# Patient Record
Sex: Male | Born: 1970 | Race: Black or African American | Hispanic: No | Marital: Married | Smoking: Never smoker
Health system: Southern US, Community
[De-identification: ages and names within clinical notes are randomized; demographics above are authoritative.]

## PROBLEM LIST (undated history)

## (undated) DIAGNOSIS — Z9189 Other specified personal risk factors, not elsewhere classified: Secondary | ICD-10-CM

---

## 2009-01-17 ENCOUNTER — Emergency Department (HOSPITAL_COMMUNITY): Admission: EM | Admit: 2009-01-17 | Discharge: 2009-01-17 | Payer: Self-pay | Admitting: Emergency Medicine

## 2009-09-05 ENCOUNTER — Emergency Department (HOSPITAL_COMMUNITY): Admission: EM | Admit: 2009-09-05 | Discharge: 2009-09-06 | Payer: Self-pay | Admitting: Emergency Medicine

## 2010-07-15 ENCOUNTER — Emergency Department (HOSPITAL_COMMUNITY)
Admission: EM | Admit: 2010-07-15 | Discharge: 2010-07-15 | Payer: Self-pay | Source: Home / Self Care | Admitting: Family Medicine

## 2010-07-15 LAB — POCT I-STAT, CHEM 8
Calcium, Ion: 1.19 mmol/L (ref 1.12–1.32)
Creatinine, Ser: 1.5 mg/dL (ref 0.4–1.5)
Glucose, Bld: 213 mg/dL — ABNORMAL HIGH (ref 70–99)
HCT: 49 % (ref 39.0–52.0)
Hemoglobin: 16.7 g/dL (ref 13.0–17.0)
Potassium: 4.1 mEq/L (ref 3.5–5.1)

## 2010-07-15 LAB — GLUCOSE, CAPILLARY: Glucose-Capillary: 222 mg/dL — ABNORMAL HIGH (ref 70–99)

## 2010-09-08 LAB — URINE MICROSCOPIC-ADD ON

## 2010-09-08 LAB — POCT I-STAT, CHEM 8
Chloride: 106 mEq/L (ref 96–112)
HCT: 50 % (ref 39.0–52.0)
Hemoglobin: 17 g/dL (ref 13.0–17.0)
Potassium: 3.7 mEq/L (ref 3.5–5.1)
Sodium: 143 mEq/L (ref 135–145)

## 2010-09-08 LAB — URINALYSIS, ROUTINE W REFLEX MICROSCOPIC
Bilirubin Urine: NEGATIVE
Hgb urine dipstick: NEGATIVE
Ketones, ur: NEGATIVE mg/dL
Specific Gravity, Urine: 1.021 (ref 1.005–1.030)
Urobilinogen, UA: 0.2 mg/dL (ref 0.0–1.0)
pH: 5.5 (ref 5.0–8.0)

## 2010-09-20 LAB — HEMOGLOBIN A1C
Hgb A1c MFr Bld: 6.3 % — ABNORMAL HIGH (ref 4.6–6.1)
Mean Plasma Glucose: 134 mg/dL

## 2011-02-02 ENCOUNTER — Emergency Department (HOSPITAL_COMMUNITY)
Admission: EM | Admit: 2011-02-02 | Discharge: 2011-02-02 | Disposition: A | Discharge status: Home / Self Care | Payer: 59 | Attending: Emergency Medicine | Admitting: Emergency Medicine

## 2011-02-02 DIAGNOSIS — S058X9A Other injuries of unspecified eye and orbit, initial encounter: Secondary | ICD-10-CM | POA: Insufficient documentation

## 2011-02-02 DIAGNOSIS — E119 Type 2 diabetes mellitus without complications: Secondary | ICD-10-CM | POA: Insufficient documentation

## 2011-11-29 ENCOUNTER — Emergency Department (HOSPITAL_COMMUNITY)
Admission: EM | Admit: 2011-11-29 | Discharge: 2011-11-29 | Disposition: A | Discharge status: Home / Self Care | Payer: BC Managed Care – PPO | Source: Home / Self Care | Attending: Emergency Medicine | Admitting: Emergency Medicine

## 2011-11-29 ENCOUNTER — Encounter (HOSPITAL_COMMUNITY): Payer: Self-pay | Admitting: *Deleted

## 2011-11-29 ENCOUNTER — Emergency Department (HOSPITAL_COMMUNITY): Payer: BC Managed Care – PPO

## 2011-11-29 ENCOUNTER — Emergency Department (HOSPITAL_COMMUNITY)
Admission: EM | Admit: 2011-11-29 | Discharge: 2011-11-29 | Disposition: A | Discharge status: Home / Self Care | Payer: BC Managed Care – PPO | Attending: Emergency Medicine | Admitting: Emergency Medicine

## 2011-11-29 DIAGNOSIS — M25519 Pain in unspecified shoulder: Secondary | ICD-10-CM

## 2011-11-29 DIAGNOSIS — M67919 Unspecified disorder of synovium and tendon, unspecified shoulder: Secondary | ICD-10-CM | POA: Insufficient documentation

## 2011-11-29 DIAGNOSIS — M719 Bursopathy, unspecified: Secondary | ICD-10-CM | POA: Insufficient documentation

## 2011-11-29 DIAGNOSIS — E119 Type 2 diabetes mellitus without complications: Secondary | ICD-10-CM | POA: Insufficient documentation

## 2011-11-29 DIAGNOSIS — R9431 Abnormal electrocardiogram [ECG] [EKG]: Secondary | ICD-10-CM

## 2011-11-29 DIAGNOSIS — R079 Chest pain, unspecified: Secondary | ICD-10-CM | POA: Insufficient documentation

## 2011-11-29 DIAGNOSIS — M25511 Pain in right shoulder: Secondary | ICD-10-CM

## 2011-11-29 LAB — CBC
MCHC: 35.5 g/dL (ref 30.0–36.0)
Platelets: 262 10*3/uL (ref 150–400)
RDW: 13.8 % (ref 11.5–15.5)
WBC: 6.2 10*3/uL (ref 4.0–10.5)

## 2011-11-29 LAB — TROPONIN I: Troponin I: <0.3 ng/mL (ref <0.30)

## 2011-11-29 LAB — POCT I-STAT, CHEM 8
HCT: 44 % (ref 39.0–52.0)
Hemoglobin: 15 g/dL (ref 13.0–17.0)
Potassium: 3.8 mEq/L (ref 3.5–5.1)
Sodium: 142 mEq/L (ref 135–145)

## 2011-11-29 LAB — POCT I-STAT TROPONIN I: Troponin i, poc: 0 ng/mL (ref 0.00–0.08)

## 2011-11-29 NOTE — ED Notes (Signed)
Pt with c/o right shoulder pain - radiates down right arm  x months worse with movement - unknown injury - pt states " friends told me could be my heart so wanted to get checked"  Denies cp or sob

## 2011-11-29 NOTE — ED Provider Notes (Signed)
History     CSN: [id number]  Arrival date & time 11/29/11  1139   First MD Initiated Contact with Patient 11/29/11 1245      Chief Complaint  Patient presents with  . Shoulder Pain    (Consider location/radiation/quality/duration/timing/severity/associated sxs/prior treatment) HPI Comments: Patient reports dull, achy, right shoulder pain for "months". He denies any recent or remote history of trauma to his shoulder. States that the pain is waking him up at night, and sometimes he wakes up the morning with his right arm numb. His pain  is aggravated with use of his right arm, especially when reaching over his head, and better with ibuprofen. He does not do repetitive or heavy lifting at work. He did use to do a lot of weight lifting. He also reports occasional chest discomfort described as "gas", but is currently not having any. This does not seem to be related to  exertion, position, eating. no nausea, vomiting, diaphoresis when having this chest discomfort. No coughing, wheezing, shortness of breath no palpitations, presyncope, syncope. No dyspnea on exertion. He is diabetic, but states that his glucose is under good control, usually in the 130s. No known family history of early coronary artery disease.    ROS as noted in HPI. All other ROS negative.   Patient is a 41 y.o. male presenting with shoulder pain. The history is provided by the patient. No language interpreter was used.  Shoulder Pain This is a recurrent problem. The current episode started more than 1 week ago.    Past Medical History  Diagnosis Date  . Diabetes mellitus     History reviewed. No pertinent past surgical history.  History reviewed. No pertinent family history.  History  Substance Use Topics  . Smoking status: Never Smoker   . Smokeless tobacco: Not on file  . Alcohol Use: Yes      Review of Systems  Allergies  Review of patient's allergies indicates no known allergies.  Home Medications    Current Outpatient Rx  Name Route Sig Dispense Refill  . IBUPROFEN 800 MG PO TABS Oral Take 800 mg by mouth every 8 (eight) hours as needed.    Marland Kitchen SITAGLIPTIN-METFORMIN HCL 50-1000 MG PO TABS Oral Take 1 tablet by mouth 2 (two) times daily with a meal.      BP 138/89  Pulse 76  Temp 97.5 F (36.4 C) (Oral)  Resp 18  SpO2 98%  Physical Exam  Nursing note and vitals reviewed. Constitutional: He is oriented to person, place, and time. He appears well-developed and well-nourished. No distress.  HENT:  Head: Normocephalic and atraumatic.  Eyes: Conjunctivae and EOM are normal.  Neck: Normal range of motion.  Cardiovascular: Normal rate, regular rhythm, normal heart sounds and intact distal pulses.   Pulmonary/Chest: Effort normal and breath sounds normal.  Abdominal: Bowel sounds are normal. He exhibits no distension.  Musculoskeletal: Normal range of motion.       Right shoulder: He exhibits tenderness. He exhibits no bony tenderness, no effusion and no crepitus.       Right shoulder with ROM normal. Pain aggravated with abduction above 90.  Drop test normal  , clavicle NT, A/C joint NT, scapula NT, proximal humerus NT, shoulder joint  tender, Motor strength normal, Sensation intact LT over deltoid region, distal NVI with hand on affected side having intact sensation and strength in the distribution of the median, radial, and ulnar nerve function.  , Positive tenderness in bicipital groove,  negative empty can  test, negative liftoff test. no instability with abduction/external rotation.   Neurological: He is alert and oriented to person, place, and time.  Skin: Skin is warm and dry.  Psychiatric: He has a normal mood and affect. His behavior is normal.    ED Course  Procedures (including critical care time)  Labs Reviewed - No data to display No results found.   1. Abnormal EKG   2. Right shoulder pain     EKG: Normal sinus rhythm, rate 64. Normal axis, normal intervals.  No hypertrophy. No ST elevation. TWI inferiorally in II, III, aVF, and anteriorally V4-V6. No previous EKG for comparison.  MDM  41 y/o diabetic AAM with R shoulder pain "for months" worse with use, c/w MSK etiology. Pt denies CP, SOB, palpitations, presyncope, syncope. States DM under control. No known FH CAD. However pt has several cardaic RF, and abnormal EKG. Transferring to ED for further workup.   Luiz Blare, MD 11/29/11 1406

## 2011-11-29 NOTE — Discharge Instructions (Signed)
Rotator Cuff Injury The rotator cuff is the collective set of muscles and tendons that make up the stabilizing unit of your shoulder. This unit holds in the ball of the humerus (upper arm bone) in the socket of the scapula (shoulder blade). Injuries to this stabilizing unit most commonly come from sports or activities that cause the arm to be moved repeatedly over the head. Examples of this include throwing, weight lifting, swimming, racquet sports, or an injury such as falling on your arm. Chronic (longstanding) irritation of this unit can cause inflammation (soreness), bursitis, and eventual damage to the tendons to the point of rupture (tear). An acute (sudden) injury of the rotator cuff can result in a partial or complete tear. You may need surgery with complete tears. Small or partial rotator cuff tears may be treated conservatively with temporary immobilization, exercises and rest. Physical therapy may be needed. HOME CARE INSTRUCTIONS   Apply ice to the injury for 15 to 20 minutes 3 to 4 times per day for the first 2 days. Put the ice in a plastic bag and place a towel between the bag of ice and your skin.   If you have a shoulder immobilizer (sling and straps), do not remove it for as long as directed by your caregiver or until you see a caregiver for a follow-up examination. If you need to remove it, move your arm as little as possible.   You may want to sleep on several pillows or in a recliner at night to lessen swelling and pain.   Only take over-the-counter or prescription medicines for pain, discomfort, or fever as directed by your caregiver.   Do simple hand squeezing exercises with a soft rubber ball to decrease hand swelling.  SEEK MEDICAL CARE IF:   Pain in your shoulder increases or new pain or numbness develops in your arm, hand, or fingers.   Your hand or fingers are colder than your other hand.  SEEK IMMEDIATE MEDICAL CARE IF:   Your arm, hand, or fingers are numb or  tingling.   Your arm, hand, or fingers are increasingly swollen and painful, or turn white or blue.  Document Released: 05/29/2000 Document Revised: 05/21/2011 Document Reviewed: 05/22/2008 [facility] Patient Information 2012 [place], [place].  RESOURCE GUIDE  Dental Problems  Patients with Medicaid: [facility] [phone] [address]                                           [phone] [address] Phone:  [phone]                                                   Phone:  [phone]  If unable to pay or uninsured, contact:  [facility] or [facility]. to become qualified for the adult dental clinic.  Chronic Pain Problems Contact [facility]  [phone] Patients need to be referred by their primary care doctor.  Insufficient Money for Medicine Contact [employer]:  call "211" or [facility] [phone].  No Primary Care Doctor Call [facility]  [phone] Other agencies  that provide inexpensive medical care    [facility]  [phone]    [facility]  [phone]    [facility]  [phone]    [facility]  [phone]    [facility]  [phone]    [facility]  [phone]  Psychological Services [facility]  [phone] [facility]  [phone] [facility]   [phone] (emergency services [phone])  Abuse/Neglect [facility] Child Abuse Hotline [phone] [facility] Child Abuse Hotline [phone] (After Hours)  Emergency Shelter [facility] [phone]  [facility] [place] [facility] [phone] [facility] [phone]  MRSA Hotline #:   830-180-9074    [facility] Resources  [facility]  [employer]                           [facility] [address] [place]                     [address]         [address]  Blondell Reveal Phone:  [phone]                                  Phone:  [phone]                   Phone:  [phone]  [facility] Phone:  [phone]  [facility] Child Abuse Hotline [phone] (After Hours)

## 2011-11-29 NOTE — ED Notes (Signed)
Pt reports right shoulder pain x several months, states that in the last week or two it has got worse. Pt was sent from urgent care for further work up. Pt had ekg completed already, and ekg noted to be abnormal but there is no ekg to compare. Pt reports he has had intermittent numbness and tingling to right arm. No weakness noted. Denies chest pain states however he has had a couple episodes of chest pain in the last couple weeks that have went away on their own.

## 2011-11-29 NOTE — ED Notes (Signed)
Pt states understanding of discharge instructions 

## 2011-11-29 NOTE — ED Provider Notes (Signed)
History     CSN: [id number]  Arrival date & time 11/29/11  1418   First MD Initiated Contact with Patient 11/29/11 1535      Chief Complaint  Patient presents with  . Shoulder Pain    (Consider location/radiation/quality/duration/timing/severity/associated sxs/prior treatment) HPI  41 year old male past medical history of hypercholesterolemia presents today with a chief complaint of 6 months of right shoulder pain.  He also endorses one or two 2 second episodes of chest pressure over the past 3 weeks in his central chest.  He denies any other symptoms suggesting ACS including arm pain jaw pain, diaphoresis, dyspnea, dyspnea with exertion, exercise intolerance, diaphoresis.  His shoulder pain has been ongoing for 7 months and is much worse with movement. 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 He makes his large turned with his right hand exclusively. He can find positions of total comfort with his right shoulder. Movement causes pain in his right shoulder. Holding his son causes pain in his right shoulder. The pain right shoulder is sharp, from 0/10-8/10 with movement or proper positioning.  He presented to urgent care today and underwent an EKG. It was abnormal. He was sent to our emergency department. He calls this illness illness mild. He taken nothing for his pain. His vital signs are normal on arrival.  Past Medical History  Diagnosis Date  . Diabetes mellitus     History reviewed. No pertinent past surgical history.  History reviewed. No pertinent family history.  History  Substance Use Topics  . Smoking status: Never Smoker   . Smokeless tobacco: Not on file  . Alcohol Use: Yes      Review of Systems Constitutional: Negative for fever and chills.  HENT: Negative for ear pain, sore throat and trouble swallowing.   Eyes: Negative for pain and visual disturbance.  Respiratory: Negative for cough and shortness of breath.   Cardiovascular: POS for chest pain (days  ago)  and neg leg swelling.  Gastrointestinal: Negative for nausea, vomiting, abdominal pain and diarrhea.  Genitourinary: Negative for dysuria, urgency and frequency.  Musculoskeletal: Negative for back pain and joint swelling. POS joint swelling.  Skin: Negative for rash and wound.  Neurological: Negative for dizziness, syncope, speech difficulty, weakness and numbness.   Allergies  Review of patient's allergies indicates no known allergies.  Home Medications   Current Outpatient Rx  Name Route Sig Dispense Refill  . IBUPROFEN 800 MG PO TABS Oral Take 800 mg by mouth every 8 (eight) hours as needed.    Marland Kitchen SITAGLIPTIN-METFORMIN HCL 50-1000 MG PO TABS Oral Take 1 tablet by mouth 2 (two) times daily with a meal.      BP 147/95  Pulse 90  Temp 97.3 F (36.3 C) (Oral)  Resp 20  SpO2 96%  Physical Exam Consitutional: Pt in no acute distress.   Head: Normocephalic and atraumatic.  Eyes: Extraocular motion intact, no scleral icterus Neck: Supple without meningismus, mass, or overt JVD Respiratory: Effort normal and breath sounds normal. No respiratory distress. CV: Heart regular rate and regular rhythm (sinus), no obvious murmurs.  Pulses +2 and symmetric Abdomen: Soft, non-tender, non-distended. No rebound or guarding.  MSK: Tenderness anterior glenohumeral joint. Pain on abduction at about 20. Pain with dorsal hand back scratching. Otherwise extremities are atraumatic without deformity, ROM intact Skin: Warm, dry, intact Neuro: Alert and oriented, no motor deficit noted.   Psychiatric: Mood and affect are normal  EKG:  Rate: 64 Rythym Sinus  Interval 144  ms. Axis: normal No gross conduction abnormalities appreciated.  TWI II, III.  STE: V2 (concave), V3: 1 mm elevation convex. The for 1 mm elevation convex. V5 one half millimeter ST elevation convex.   No previous.   ED Course  Procedures (including critical care time)  Labs Reviewed  POCT I-STAT, CHEM 8 - Abnormal;  Notable for the following:    Glucose, Bld 150 (*)     All other components within normal limits  CBC  POCT I-STAT TROPONIN I  TROPONIN I  TROPONIN I   Dg Chest 2 View  11/29/2011  *RADIOLOGY REPORT*  Clinical Data: Right shoulder pain and hand numbness.  CHEST - 2 VIEW  Comparison: None.  Findings:  Cardiopericardial silhouette within normal limits. Mediastinal contours normal. Trachea midline.  No airspace disease or effusion.  IMPRESSION: Negative two-view chest.  Original Report Authenticated By: Andreas [place], M.D.   Dg Shoulder Right  11/29/2011  *RADIOLOGY REPORT*  Clinical Data: Shoulder pain for past month.  RIGHT SHOULDER - 2+ VIEW  Comparison: No priors.  Findings: Three views of the right shoulder demonstrate no acute fracture, subluxation, dislocation, joint or soft tissue abnormality.  IMPRESSION:  1.  No acute radiographic abnormality of the right shoulder.  Original Report Authenticated By: Florencia Reasons, M.D.     1. Rotator cuff disorder   2. Abnormal EKG       MDM  Poor story for acute coronary syndrome. As outlined above the patient has very minimal chest pressure on 2 occasions in the last 2 weeks. No dyspnea with exertion or pain with exertion. No shortness of breath or diaphoresis.    His right shoulder is clearly mechanical/musculoskeletal.  However when he saw rapid care today, he had an abnormal EKG. EKG outlined above. Discuss case with cardiology. He'll see the patient in clinic. We will rule out with 2 troponins here and have the patient followup.   3 troponins negative.  PT DC home stable.  Discussed with pt the clinical impression, treatment in the ED, and follow up plan.  We alslo discussed the indications for returning to the ED, which include shortness or breath, confusion, fever, new weakness or numbness, chest pain, or any other concerning symptom.  The pt understood the treatment and plan, is stable, and is able to leave the ED.           Larrie Kass, MD 11/29/11 [phone]

## 2011-12-01 NOTE — ED Provider Notes (Signed)
I saw and evaluated the patient, reviewed the resident's note and I agree with the findings and plan, ekg interpretation (reviewed by me).  PW right shoulder pain, chronic and now worsening. TTP on exam. Full ROM but with pain. Neurovasc intact. Referred here from [facility] for abnl EKG and reports 2 episodes of CP over past 2-3 weeks. Atypical. Delta troponin and ED W/U unremarkable. Pain control, needs PMD referral.  Forbes Cellar, MD 12/01/11 1616

## 2013-01-20 ENCOUNTER — Encounter (HOSPITAL_COMMUNITY): Payer: Self-pay

## 2013-01-20 ENCOUNTER — Emergency Department (HOSPITAL_COMMUNITY)
Admission: EM | Admit: 2013-01-20 | Discharge: 2013-01-21 | Disposition: A | Discharge status: Home / Self Care | Payer: BC Managed Care – PPO | Attending: Emergency Medicine | Admitting: Emergency Medicine

## 2013-01-20 DIAGNOSIS — R112 Nausea with vomiting, unspecified: Secondary | ICD-10-CM

## 2013-01-20 DIAGNOSIS — Z79899 Other long term (current) drug therapy: Secondary | ICD-10-CM | POA: Insufficient documentation

## 2013-01-20 DIAGNOSIS — E119 Type 2 diabetes mellitus without complications: Secondary | ICD-10-CM | POA: Insufficient documentation

## 2013-01-20 DIAGNOSIS — R195 Other fecal abnormalities: Secondary | ICD-10-CM

## 2013-01-20 DIAGNOSIS — G8929 Other chronic pain: Secondary | ICD-10-CM

## 2013-01-20 DIAGNOSIS — R51 Headache: Secondary | ICD-10-CM | POA: Insufficient documentation

## 2013-01-20 DIAGNOSIS — H1013 Acute atopic conjunctivitis, bilateral: Secondary | ICD-10-CM

## 2013-01-20 DIAGNOSIS — H1045 Other chronic allergic conjunctivitis: Secondary | ICD-10-CM | POA: Insufficient documentation

## 2013-01-20 LAB — COMPREHENSIVE METABOLIC PANEL
Albumin: 4.4 g/dL (ref 3.5–5.2)
Alkaline Phosphatase: 61 U/L (ref 39–117)
BUN: 10 mg/dL (ref 6–23)
Creatinine, Ser: 1.29 mg/dL (ref 0.50–1.35)
Potassium: 3.8 mEq/L (ref 3.5–5.1)
Total Protein: 7.5 g/dL (ref 6.0–8.3)

## 2013-01-20 LAB — CBC WITH DIFFERENTIAL/PLATELET
Basophils Relative: 0 % (ref 0–1)
Eosinophils Absolute: 0.1 10*3/uL (ref 0.0–0.7)
Hemoglobin: 14.1 g/dL (ref 13.0–17.0)
MCH: 29.6 pg (ref 26.0–34.0)
MCHC: 35.8 g/dL (ref 30.0–36.0)
Monocytes Relative: 7 % (ref 3–12)
Neutrophils Relative %: 47 % (ref 43–77)
RDW: 13.9 % (ref 11.5–15.5)

## 2013-01-20 LAB — LIPASE, BLOOD: Lipase: 57 U/L (ref 11–59)

## 2013-01-20 NOTE — ED Notes (Addendum)
Pt reports intermittent headache, nausea, and vomiiting x3 months. Pt reports he is renting a town home and it has been under evaluation leaking pipes in the shower, pt states the "wall caves in" when you touch it with your hand. Pt is concerned his family has been exposed to mold. Pt also c/o dry nose and diarrhea

## 2013-01-21 MED ORDER — NAPHAZOLINE-PHENIRAMINE 0.025-0.3 % OP SOLN: 1.0000 [drp] | OPHTHALMIC | Status: DC | PRN

## 2013-01-21 MED ORDER — ONDANSETRON 4 MG PO TBDP: 4.0000 mg | ORAL_TABLET | Freq: Four times a day (QID) | ORAL | Status: DC | PRN

## 2013-01-21 NOTE — ED Provider Notes (Signed)
CSN: [id number]     Arrival date & time 01/20/13  2047 History     First MD Initiated Contact with Patient 01/21/13 0015     Chief Complaint  Patient presents with  . Headache  . Emesis    HPI Oscar Sampson is a 42 y.o. male presenting with multiple complaints. Patient is having loose stools during the evening, 1-2 times daily. He's had some intermittent nausea and vomiting.  He said no abdominal pain, no fevers and no chills. Patient  also complaining about bilateral eye irritation, redness, watering, no changes in vision otherwise.  Patient is concerned because he moved into a home that has been found to have black mold in the walls, they've been living there for 3 months and symptoms started when he moved in.  He has not seen his primary care physician about this. He has not changed his home medicines. He takes naproxen for his headaches. All of his symptoms have been moderate, intermittent, is not taken anything for them.   Past Medical History  Diagnosis Date  . Diabetes mellitus    History reviewed. No pertinent past surgical history. History reviewed. No pertinent family history. History  Substance Use Topics  . Smoking status: Never Smoker   . Smokeless tobacco: Not on file  . Alcohol Use: Yes    Review of Systems At least 10pt or greater review of systems completed and are negative except where specified in the HPI.  Allergies  Review of patient's allergies indicates no known allergies.  Home Medications   Current Outpatient Rx  Name  Route  Sig  Dispense  Refill  . sitaGLIPtan-metformin (JANUMET) 50-1000 MG per tablet   Oral   Take 1 tablet by mouth 2 (two) times daily with a meal.          BP 137/86  Pulse 94  Temp(Src) 98.1 F (36.7 C) (Oral)  Resp 16  SpO2 95% Physical Exam  Nursing notes reviewed.  Electronic medical record reviewed. VITAL SIGNS:   Filed Vitals:   01/20/13 2051  BP: 137/86  Pulse: 94  Temp: 98.1 F (36.7 C)  TempSrc: Oral   Resp: 16  SpO2: 95%   CONSTITUTIONAL: Awake, oriented, appears non-toxic HENT: Atraumatic, normocephalic, oral mucosa pink and moist, airway patent. Nares patent without drainage. External ears normal. EYES: Conjunctiva clear, EOMI, PERRLA NECK: Trachea midline, non-tender, supple CARDIOVASCULAR: Normal heart rate, Normal rhythm, No murmurs, rubs, gallops PULMONARY/CHEST: Clear to auscultation, no rhonchi, wheezes, or rales. Symmetrical breath sounds. Non-tender. ABDOMINAL: Non-distended, soft, non-tender - no rebound or guarding.  BS normal. NEUROLOGIC: Non-focal, moving all four extremities, no gross sensory or motor deficits. EXTREMITIES: No clubbing, cyanosis, or edema SKIN: Warm, Dry, No erythema, No rash  ED Course   Procedures (including critical care time)  Labs Reviewed  COMPREHENSIVE METABOLIC PANEL - Abnormal; Notable for the following:    Glucose, Bld 153 (*)    AST 54 (*)    ALT 121 (*)    GFR calc non Af Amer 67 (*)    GFR calc Af Amer 78 (*)    All other components within normal limits  CBC WITH DIFFERENTIAL  LIPASE, BLOOD   No results found. 1. Chronic headaches   2. Nausea and vomiting   3. Loose stools   4. Allergic conjunctivitis, bilateral     MDM  Patient presents with multiple complaints, none of which are emergent, patient is benign, benign abdominal exam, soft and dehydrated, is in no pain, no  acute distress, is afebrile, vital signs are stable within normal limits. Triage labs had been ordered, they're unremarkable-patient does have an elevation in AST and ALT is very slight - can follow up with his primary care physician, this may be a result of his Janumet.  I explained the diagnosis and have given explicit precautions to return to the ER including any other new or worsening symptoms. The patient understands and accepts the medical plan as it's been dictated and I have answered their questions. Discharge instructions concerning home care and  prescriptions have been given.  The patient is STABLE and is discharged to home in good condition.   Jones Skene, MD 01/21/13 0045

## 2013-11-09 ENCOUNTER — Other Ambulatory Visit: Payer: Self-pay | Admitting: Specialist

## 2013-11-09 DIAGNOSIS — M25819 Other specified joint disorders, unspecified shoulder: Secondary | ICD-10-CM

## 2013-11-09 DIAGNOSIS — M7580 Other shoulder lesions, unspecified shoulder: Principal | ICD-10-CM

## 2013-11-20 ENCOUNTER — Ambulatory Visit
Admission: RE | Admit: 2013-11-20 | Discharge: 2013-11-20 | Disposition: A | Discharge status: Home / Self Care | Payer: BC Managed Care – PPO | Source: Ambulatory Visit | Attending: Specialist | Admitting: Specialist

## 2013-11-20 DIAGNOSIS — M7580 Other shoulder lesions, unspecified shoulder: Principal | ICD-10-CM

## 2013-11-20 DIAGNOSIS — M25819 Other specified joint disorders, unspecified shoulder: Secondary | ICD-10-CM

## 2013-11-20 MED ORDER — IOHEXOL 180 MG/ML  SOLN
20.0000 mL | Freq: Once | INTRAMUSCULAR | Status: AC | PRN
  Administered 2013-11-20: 20 mL via INTRA_ARTICULAR

## 2013-12-01 ENCOUNTER — Encounter (HOSPITAL_BASED_OUTPATIENT_CLINIC_OR_DEPARTMENT_OTHER): Payer: Self-pay | Admitting: *Deleted

## 2013-12-01 NOTE — Progress Notes (Signed)
12/01/13 1020  OBSTRUCTIVE SLEEP APNEA  Have you ever been diagnosed with sleep apnea through a sleep study? No  Do you snore loudly (loud enough to be heard through closed doors)?  1  Do you often feel tired, fatigued, or sleepy during the daytime? 1  Has anyone observed you stop breathing during your sleep? 0  Do you have, or are you being treated for high blood pressure? 0  BMI more than 35 kg/m2? 0  Age over 43 years old? 0  Neck circumference greater than 40 cm/16 inches? 1  Gender: 1  Obstructive Sleep Apnea Score 4

## 2013-12-01 NOTE — Progress Notes (Signed)
To [facility] at 1230- Istat,Ekg on arrival-instructed NPO after Mn-

## 2013-12-04 ENCOUNTER — Other Ambulatory Visit: Payer: Self-pay | Admitting: Orthopedic Surgery

## 2013-12-04 ENCOUNTER — Encounter (HOSPITAL_BASED_OUTPATIENT_CLINIC_OR_DEPARTMENT_OTHER): Payer: Self-pay | Admitting: *Deleted

## 2013-12-05 ENCOUNTER — Ambulatory Visit (HOSPITAL_BASED_OUTPATIENT_CLINIC_OR_DEPARTMENT_OTHER)
Admission: RE | Admit: 2013-12-05 | Discharge: 2013-12-05 | Disposition: A | Discharge status: Home / Self Care | Payer: BC Managed Care – PPO | Source: Ambulatory Visit | Attending: Specialist | Admitting: Specialist

## 2013-12-05 ENCOUNTER — Ambulatory Visit (HOSPITAL_BASED_OUTPATIENT_CLINIC_OR_DEPARTMENT_OTHER): Payer: BC Managed Care – PPO | Admitting: Anesthesiology

## 2013-12-05 ENCOUNTER — Other Ambulatory Visit: Payer: Self-pay

## 2013-12-05 ENCOUNTER — Encounter (HOSPITAL_BASED_OUTPATIENT_CLINIC_OR_DEPARTMENT_OTHER): Payer: Self-pay | Admitting: *Deleted

## 2013-12-05 ENCOUNTER — Encounter (HOSPITAL_BASED_OUTPATIENT_CLINIC_OR_DEPARTMENT_OTHER): Payer: BC Managed Care – PPO | Admitting: Anesthesiology

## 2013-12-05 ENCOUNTER — Encounter (HOSPITAL_BASED_OUTPATIENT_CLINIC_OR_DEPARTMENT_OTHER)
Admission: RE | Disposition: A | Discharge status: Home / Self Care | Payer: Self-pay | Source: Ambulatory Visit | Attending: Specialist

## 2013-12-05 DIAGNOSIS — M719 Bursopathy, unspecified: Secondary | ICD-10-CM | POA: Insufficient documentation

## 2013-12-05 DIAGNOSIS — M19019 Primary osteoarthritis, unspecified shoulder: Secondary | ICD-10-CM | POA: Insufficient documentation

## 2013-12-05 DIAGNOSIS — M7580 Other shoulder lesions, unspecified shoulder: Secondary | ICD-10-CM

## 2013-12-05 DIAGNOSIS — M67919 Unspecified disorder of synovium and tendon, unspecified shoulder: Secondary | ICD-10-CM | POA: Insufficient documentation

## 2013-12-05 DIAGNOSIS — M25819 Other specified joint disorders, unspecified shoulder: Secondary | ICD-10-CM | POA: Insufficient documentation

## 2013-12-05 DIAGNOSIS — Z79899 Other long term (current) drug therapy: Secondary | ICD-10-CM | POA: Insufficient documentation

## 2013-12-05 DIAGNOSIS — Z9889 Other specified postprocedural states: Secondary | ICD-10-CM

## 2013-12-05 DIAGNOSIS — E119 Type 2 diabetes mellitus without complications: Secondary | ICD-10-CM | POA: Insufficient documentation

## 2013-12-05 DIAGNOSIS — M24119 Other articular cartilage disorders, unspecified shoulder: Secondary | ICD-10-CM | POA: Insufficient documentation

## 2013-12-05 HISTORY — PX: SHOULDER ARTHROSCOPY WITH SUBACROMIAL DECOMPRESSION, ROTATOR CUFF REPAIR AND BICEP TENDON REPAIR: SHX5687

## 2013-12-05 HISTORY — DX: Other specified personal risk factors, not elsewhere classified: Z91.89

## 2013-12-05 LAB — POCT I-STAT 4, (NA,K, GLUC, HGB,HCT)
Glucose, Bld: 140 mg/dL — ABNORMAL HIGH (ref 70–99)
HEMATOCRIT: 43 % (ref 39.0–52.0)
HEMOGLOBIN: 14.6 g/dL (ref 13.0–17.0)
Potassium: 3.8 mEq/L (ref 3.7–5.3)
SODIUM: 143 meq/L (ref 137–147)

## 2013-12-05 LAB — GLUCOSE, CAPILLARY: Glucose-Capillary: 177 mg/dL — ABNORMAL HIGH (ref 70–99)

## 2013-12-05 SURGERY — SHOULDER ARTHROSCOPY WITH SUBACROMIAL DECOMPRESSION, ROTATOR CUFF REPAIR AND BICEP TENDON REPAIR
Anesthesia: General | Site: Shoulder | Laterality: Right

## 2013-12-05 MED ORDER — ROPIVACAINE HCL 5 MG/ML IJ SOLN
Freq: Once | INTRAMUSCULAR | Status: DC
  Filled 2013-12-05 (×4): qty 30

## 2013-12-05 MED ORDER — FENTANYL CITRATE 0.05 MG/ML IJ SOLN
100.0000 ug | Freq: Once | INTRAMUSCULAR | Status: AC
  Administered 2013-12-05: 100 ug via INTRAVENOUS
  Filled 2013-12-05: qty 2

## 2013-12-05 MED ORDER — SODIUM CHLORIDE 0.9 % IR SOLN
Status: DC | PRN
  Administered 2013-12-05: 12000 mL

## 2013-12-05 MED ORDER — FENTANYL CITRATE 0.05 MG/ML IJ SOLN
INTRAMUSCULAR | Status: DC | PRN
  Administered 2013-12-05 (×2): 50 ug via INTRAVENOUS
  Administered 2013-12-05: 100 ug via INTRAVENOUS

## 2013-12-05 MED ORDER — ONDANSETRON HCL 4 MG/2ML IJ SOLN
INTRAMUSCULAR | Status: DC | PRN
  Administered 2013-12-05: 4 mg via INTRAVENOUS

## 2013-12-05 MED ORDER — CEFAZOLIN SODIUM-DEXTROSE 2-3 GM-% IV SOLR
2.0000 g | INTRAVENOUS | Status: AC
  Administered 2013-12-05: 2 g via INTRAVENOUS
  Filled 2013-12-05: qty 50

## 2013-12-05 MED ORDER — FENTANYL CITRATE 0.05 MG/ML IJ SOLN
25.0000 ug | INTRAMUSCULAR | Status: DC | PRN
  Filled 2013-12-05: qty 1

## 2013-12-05 MED ORDER — PROMETHAZINE HCL 25 MG/ML IJ SOLN
6.2500 mg | INTRAMUSCULAR | Status: DC | PRN
  Filled 2013-12-05: qty 1

## 2013-12-05 MED ORDER — EPINEPHRINE HCL 1 MG/ML IJ SOLN
INTRAMUSCULAR | Status: DC | PRN
  Administered 2013-12-05: 2 mg

## 2013-12-05 MED ORDER — ACETAMINOPHEN 10 MG/ML IV SOLN
INTRAVENOUS | Status: DC | PRN
  Administered 2013-12-05: 1000 mg via INTRAVENOUS

## 2013-12-05 MED ORDER — METHOCARBAMOL 500 MG PO TABS: 500.0000 mg | ORAL_TABLET | Freq: Four times a day (QID) | ORAL | Status: DC | PRN

## 2013-12-05 MED ORDER — STERILE WATER FOR IRRIGATION IR SOLN
Status: DC | PRN
  Administered 2013-12-05: 500 mL

## 2013-12-05 MED ORDER — SUCCINYLCHOLINE CHLORIDE 20 MG/ML IJ SOLN
INTRAMUSCULAR | Status: DC | PRN
Start: 2013-12-05 — End: 2013-12-05
  Administered 2013-12-05: 140 mg via INTRAVENOUS

## 2013-12-05 MED ORDER — MIDAZOLAM HCL 2 MG/2ML IJ SOLN
INTRAMUSCULAR | Status: AC
  Filled 2013-12-05: qty 2

## 2013-12-05 MED ORDER — LIDOCAINE HCL (CARDIAC) 20 MG/ML IV SOLN
INTRAVENOUS | Status: DC | PRN
  Administered 2013-12-05: 100 mg via INTRAVENOUS

## 2013-12-05 MED ORDER — CEPHALEXIN 500 MG PO CAPS: 500.0000 mg | ORAL_CAPSULE | Freq: Three times a day (TID) | ORAL | Status: DC

## 2013-12-05 MED ORDER — DEXAMETHASONE SODIUM PHOSPHATE 4 MG/ML IJ SOLN
INTRAMUSCULAR | Status: DC | PRN
  Administered 2013-12-05: 10 mg via INTRAVENOUS

## 2013-12-05 MED ORDER — FENTANYL CITRATE 0.05 MG/ML IJ SOLN
INTRAMUSCULAR | Status: AC
  Filled 2013-12-05: qty 4

## 2013-12-05 MED ORDER — SODIUM CHLORIDE 0.9 % IJ SOLN
INTRAMUSCULAR | Status: DC | PRN
  Administered 2013-12-05: 20 mL

## 2013-12-05 MED ORDER — SODIUM CHLORIDE 0.9 % IV SOLN
INTRAVENOUS | Status: DC
  Filled 2013-12-05: qty 1000

## 2013-12-05 MED ORDER — LACTATED RINGERS IV SOLN
INTRAVENOUS | Status: DC
  Administered 2013-12-05: 18:00:00 via INTRAVENOUS
  Filled 2013-12-05: qty 1000

## 2013-12-05 MED ORDER — METOCLOPRAMIDE HCL 5 MG/ML IJ SOLN
INTRAMUSCULAR | Status: DC | PRN
  Administered 2013-12-05: 10 mg via INTRAVENOUS

## 2013-12-05 MED ORDER — BUPIVACAINE HCL 0.25 % IJ SOLN
INTRAMUSCULAR | Status: DC | PRN
  Administered 2013-12-05: 20 mL

## 2013-12-05 MED ORDER — OXYCODONE-ACETAMINOPHEN 5-325 MG PO TABS: 1.0000 | ORAL_TABLET | ORAL | Status: DC | PRN

## 2013-12-05 MED ORDER — PROPOFOL 10 MG/ML IV BOLUS
INTRAVENOUS | Status: DC | PRN
  Administered 2013-12-05: 200 mg via INTRAVENOUS
  Administered 2013-12-05: 50 mg via INTRAVENOUS

## 2013-12-05 MED ORDER — MIDAZOLAM HCL 2 MG/2ML IJ SOLN
2.0000 mg | Freq: Once | INTRAMUSCULAR | Status: AC
  Administered 2013-12-05: 2 mg via INTRAVENOUS
  Filled 2013-12-05: qty 2

## 2013-12-05 MED ORDER — ROPIVACAINE HCL 5 MG/ML IJ SOLN
INTRAMUSCULAR | Status: DC | PRN
  Administered 2013-12-05: 14:00:00
  Administered 2013-12-05: 30.9 mL

## 2013-12-05 MED ORDER — FENTANYL CITRATE 0.05 MG/ML IJ SOLN
INTRAMUSCULAR | Status: AC
  Filled 2013-12-05: qty 2

## 2013-12-05 MED ORDER — POVIDONE-IODINE 7.5 % EX SOLN
Freq: Once | CUTANEOUS | Status: DC
  Filled 2013-12-05: qty 118

## 2013-12-05 MED ORDER — KETOROLAC TROMETHAMINE 30 MG/ML IJ SOLN
INTRAMUSCULAR | Status: DC | PRN
  Administered 2013-12-05: 30 mg via INTRAVENOUS

## 2013-12-05 MED ORDER — LACTATED RINGERS IV SOLN
INTRAVENOUS | Status: DC
  Administered 2013-12-05 (×2): via INTRAVENOUS
  Filled 2013-12-05: qty 1000

## 2013-12-05 MED ORDER — MEPERIDINE HCL 25 MG/ML IJ SOLN
6.2500 mg | INTRAMUSCULAR | Status: DC | PRN
  Filled 2013-12-05: qty 1

## 2013-12-05 SURGICAL SUPPLY — 48 items
BLADE CUDA GRT WHITE 3.5 (BLADE) ×2 IMPLANT
BLADE GREAT WHITE 4.2 (BLADE) ×2 IMPLANT
BLADE SURG 11 STRL SS (BLADE) ×2 IMPLANT
BLADE SURG 15 STRL LF DISP TIS (BLADE) ×1 IMPLANT
BLADE SURG 15 STRL SS (BLADE) ×1
BUR OVAL 6.0 (BURR) ×2 IMPLANT
CANISTER SUCT LVC 12 LTR MEDI- (MISCELLANEOUS) ×6 IMPLANT
CANNULA 5.75X7 CRYSTAL CLEAR (CANNULA) ×2 IMPLANT
COVER MAYO STAND STRL (DRAPES) ×2 IMPLANT
COVER TABLE BACK 60X90 (DRAPES) ×2 IMPLANT
DRAPE LG THREE QUARTER DISP (DRAPES) ×2 IMPLANT
DRAPE ORTHO SPLIT 77X108 STRL (DRAPES) ×2
DRAPE POUCH INSTRU U-SHP 10X18 (DRAPES) ×2 IMPLANT
DRAPE STERI 35X30 U-POUCH (DRAPES) ×2 IMPLANT
DRAPE SURG 17X23 STRL (DRAPES) ×2 IMPLANT
DRAPE SURG ORHT 6 SPLT 77X108 (DRAPES) ×2 IMPLANT
DRAPE U-SHAPE 47X51 STRL (DRAPES) ×2 IMPLANT
DURAPREP 26ML APPLICATOR (WOUND CARE) ×2 IMPLANT
GAUZE XEROFORM 1X8 LF (GAUZE/BANDAGES/DRESSINGS) ×2 IMPLANT
GLOVE BIO SURGEON STRL SZ 6 (GLOVE) ×2 IMPLANT
GLOVE BIO SURGEON STRL SZ7.5 (GLOVE) ×2 IMPLANT
GLOVE INDICATOR 6.5 STRL GRN (GLOVE) ×2 IMPLANT
GLOVE INDICATOR 8.0 STRL GRN (GLOVE) ×4 IMPLANT
GLOVE SURG ORTHO 8.0 STRL STRW (GLOVE) ×2 IMPLANT
GOWN STRL REUS W/ TWL XL LVL3 (GOWN DISPOSABLE) ×2 IMPLANT
GOWN STRL REUS W/TWL LRG LVL3 (GOWN DISPOSABLE) ×2 IMPLANT
GOWN STRL REUS W/TWL XL LVL3 (GOWN DISPOSABLE) ×2
IV NS IRRIG 3000ML ARTHROMATIC (IV SOLUTION) ×12 IMPLANT
KIT SHOULDER TRACTION (DRAPES) ×2 IMPLANT
NEEDLE HYPO 22GX1.5 SAFETY (NEEDLE) ×2 IMPLANT
NEEDLE SPNL 18GX3.5 QUINCKE PK (NEEDLE) ×2 IMPLANT
PACK BASIN DAY SURGERY FS (CUSTOM PROCEDURE TRAY) ×2 IMPLANT
PAD ABD 8X10 STRL (GAUZE/BANDAGES/DRESSINGS) ×4 IMPLANT
SET ARTHROSCOPY TUBING (MISCELLANEOUS) ×1
SET ARTHROSCOPY TUBING PVC (MISCELLANEOUS) ×1 IMPLANT
SLING ARM FOAM STRAP XLG (SOFTGOODS) ×2 IMPLANT
SLING ULTRA II AB L (ORTHOPEDIC SUPPLIES) IMPLANT
SLING ULTRA II AB S (ORTHOPEDIC SUPPLIES) IMPLANT
SPONGE GAUZE 4X4 12PLY (GAUZE/BANDAGES/DRESSINGS) ×2 IMPLANT
SYR 20CC LL (SYRINGE) ×2 IMPLANT
SYR CONTROL 10ML LL (SYRINGE) ×2 IMPLANT
SYR TB 1ML 27GX1/2 SAFE (SYRINGE) IMPLANT
SYR TB 1ML 27GX1/2 SAFETY (SYRINGE)
TAPE HYPAFIX 4 X10 (GAUZE/BANDAGES/DRESSINGS) ×2 IMPLANT
TOWEL OR 17X24 6PK STRL BLUE (TOWEL DISPOSABLE) ×4 IMPLANT
TUBE CONNECTING 12X1/4 (SUCTIONS) ×6 IMPLANT
WAND 90 DEG TURBOVAC W/CORD (SURGICAL WAND) ×2 IMPLANT
WATER STERILE IRR 500ML POUR (IV SOLUTION) ×2 IMPLANT

## 2013-12-05 NOTE — Op Note (Signed)
Dictated#[id number]

## 2013-12-05 NOTE — H&P (Signed)
Oscar Sampson is an 43 y.o. male.   Chief Complaint: right shoulder pain for months HPI: Patient presents with right shoulder discomfort that had been persistent for several monthss now. Despite conservative treatments, his discomfort has not improved. Imaging was obtained. Other conservative and surgical treatments were discussed in detail. Patient wishes to proceed with surgery as consented. Denies SOB, CP, or calf pain. No Fever, chills, or nausea/ vomiting.   Past Medical History  Diagnosis Date  . Diabetes mellitus 2012  . At risk for sleep apnea     STOP-BANG= 4    SENT TO PCP  12-01-2013    History reviewed. No pertinent past surgical history.  History reviewed. No pertinent family history. Social History:  reports that he has never smoked. He does not have any smokeless tobacco history on file. He reports that he drinks alcohol. He reports that he does not use illicit drugs.  Allergies: No Known Allergies  Medications Prior to Admission  Medication Sig Dispense Refill  . sitaGLIPtan-metformin (JANUMET) 50-1000 MG per tablet Take 1 tablet by mouth 2 (two) times daily with a meal.      . naphazoline-pheniramine (NAPHCON-A) 0.025-0.3 % ophthalmic solution Place 1 drop into both eyes every 4 (four) hours as needed.  5 mL  0  . ondansetron (ZOFRAN ODT) 4 MG disintegrating tablet Take 1 tablet (4 mg total) by mouth every 6 (six) hours as needed for nausea.  10 tablet  0    Results for orders placed during the hospital encounter of 12/05/13 (from the past 48 hour(s))  POCT I-STAT 4, (NA,K, GLUC, HGB,HCT)     Status: Abnormal   Collection Time    12/05/13 12:46 PM      Result Value Ref Range   Sodium 143  137 - 147 mEq/L   Potassium 3.8  3.7 - 5.3 mEq/L   Glucose, Bld 140 (*) 70 - 99 mg/dL   HCT 43.0  39.0 - 52.0 %   Hemoglobin 14.6  13.0 - 17.0 g/dL   No results found.  Review of Systems  Constitutional: Negative.   HENT: Negative.   Eyes: Negative.   Respiratory:  Negative.   Cardiovascular: Negative.   Gastrointestinal: Negative.   Genitourinary: Negative.   Musculoskeletal: Positive for joint pain.  Skin: Negative.   Neurological: Negative.   Endo/Heme/Allergies: Negative.   Psychiatric/Behavioral: Negative.     Blood pressure 135/80, pulse 74, temperature 98.3 F (36.8 C), temperature source Oral, resp. rate 12, height 5\' 11"  (1.803 m), weight 105.235 kg (232 lb), SpO2 100.00%. Physical Exam  Constitutional: He is oriented to person, place, and time. He appears well-developed.  HENT:  Head: Normocephalic.  Eyes: EOM are normal.  Neck: Normal range of motion.  Cardiovascular: Normal rate, regular rhythm, normal heart sounds and intact distal pulses.   Respiratory: Effort normal and breath sounds normal.  GI: Soft. Bowel sounds are normal.  Genitourinary:  Deferred  Musculoskeletal:  Right shoulder limited  ROM and Pain with ROM.  Neurological: He is alert and oriented to person, place, and time.  Skin: Skin is warm and dry.  Psychiatric: His behavior is normal.     Assessment/Plan right shoulder Labral tear , ACOA, and partial rotator cuff tear: righ tshoulder Scope SAD , DCR, Possible labral repair vs debridement, and RCR D/c home today F/u in office in 7 days Follow d/c instructions Take meds as directed  STILWELL, BRYSON L 12/05/2013, 3:03 PM

## 2013-12-05 NOTE — Anesthesia Procedure Notes (Signed)
Anesthesia Regional Block:  Supraclavicular block  Pre-Anesthetic Checklist: ,, timeout performed, Correct Patient, Correct Site, Correct Laterality, Correct Procedure, Correct Position, site marked, Risks and benefits discussed,  Surgical consent,  Pre-op evaluation,  At surgeon's request and post-op pain management  Laterality: Right and Upper  Prep: chloraprep       Needles:  Injection technique: Single-shot  Needle Type: Stimiplex     Needle Length: 10cm 10 cm Needle Gauge: 21 and 21 G    Additional Needles:  Procedures: ultrasound guided (picture in chart) and nerve stimulator Supraclavicular block Narrative:  Start time: 12/05/2013 2:11 PM Injection made incrementally with aspirations every 5 mL.  Performed by: Personally  Anesthesiologist: Montez Hageman MD  Additional Notes: Risks, benefits and alternative to block explained extensively.  Patient tolerated procedure well, without complications.

## 2013-12-05 NOTE — Discharge Instructions (Signed)
°  Post Anesthesia Home Care Instructions ° °Activity: °Get plenty of rest for the remainder of the day. A responsible adult should stay with you for 24 hours following the procedure.  °For the next 24 hours, DO NOT: °-Drive a car °-Operate machinery °-Drink alcoholic beverages °-Take any medication unless instructed by your physician °-Make any legal decisions or sign important papers. ° °Meals: °Start with liquid foods such as gelatin or soup. Progress to regular foods as tolerated. Avoid greasy, spicy, heavy foods. If nausea and/or vomiting occur, drink only clear liquids until the nausea and/or vomiting subsides. Call your physician if vomiting continues. ° °Special Instructions/Symptoms: °Your throat may feel dry or sore from the anesthesia or the breathing tube placed in your throat during surgery. If this causes discomfort, gargle with warm salt water. The discomfort should disappear within 24 hours. ° °Regional Anesthesia Blocks ° °1. Numbness or the inability to move the "blocked" extremity may last from 3-48 hours after placement. The length of time depends on the medication injected and your individual response to the medication. If the numbness is not going away after 48 hours, call your surgeon. ° °2. The extremity that is blocked will need to be protected until the numbness is gone and the  Strength has returned. Because you cannot feel it, you will need to take extra care to avoid injury. Because it may be weak, you may have difficulty moving it or using it. You may not know what position it is in without looking at it while the block is in effect. ° °3. For blocks in the legs and feet, returning to weight bearing and walking needs to be done carefully. You will need to wait until the numbness is entirely gone and the strength has returned. You should be able to move your leg and foot normally before you try and bear weight or walk. You will need someone to be with you when you first try to ensure you  do not fall and possibly risk injury. ° °4. Bruising and tenderness at the needle site are common side effects and will resolve in a few days. ° °5. Persistent numbness or new problems with movement should be communicated to the surgeon or the [facility] ([phone])/ [facility] ([phone]). °

## 2013-12-05 NOTE — Transfer of Care (Signed)
Immediate Anesthesia Transfer of Care Note  Patient: Oscar Sampson  Procedure(s) Performed: Procedure(s) (LRB): RGHT SHOULDER ARTHROSCOPY WITH SUBACROMIAL DECOMPRESSION,DISTAL CLAVICLE RESECTION, LABRAL DEBRIDEMENT  (Right)  Patient Location: PACU  Anesthesia Type: General  Level of Consciousness:drowsy  Airway & Oxygen Therapy: Patient Spontanous Breathing and Patient connected to face mask oxygen  Post-op Assessment: Report given to PACU RN and Post -op Vital signs reviewed and stable  Post vital signs: Reviewed and stable  Complications: No apparent anesthesia complications

## 2013-12-05 NOTE — H&P (View-Only) (Signed)
To [facility] at 1230- [place],Ekg on arrival-instructed NPO after Mn-

## 2013-12-05 NOTE — Interval H&P Note (Signed)
History and Physical Interval Note:  12/05/2013 3:16 PM  Oscar Sampson  has presented today for surgery, with the diagnosis of RIGHT SHOULDER ROTATOR CUFF INSTABILITY AC OA LABRAL TEAR PARTIAL ROTATOR CUFF TEAR   The various methods of treatment have been discussed with the patient and family. After consideration of risks, benefits and other options for treatment, the patient has consented to  Procedure(s): RGHT SHOULDER ARTHROSCOPY WITH SUBACROMIAL DECOMPRESSION,DISTAL CLAVICLE RESECTION LABRAL REPAIR VERSES DEBRIDEMENT POSSIBLE ROTATOR CUFF REPAIR  (Right) as a surgical intervention .  The patient's history has been reviewed, patient examined, no change in status, stable for surgery.  I have reviewed the patient's chart and labs.  Questions were answered to the patient's satisfaction.     COLLINS,ROBERT ANDREW

## 2013-12-05 NOTE — Anesthesia Preprocedure Evaluation (Signed)
Anesthesia Evaluation  Patient identified by MRN, date of birth, ID band Patient awake    Reviewed: Allergy & Precautions, H&P , NPO status , Patient's Chart, lab work & pertinent test results  Airway Mallampati: II TM Distance: >3 FB Neck ROM: Full    Dental no notable dental hx.    Pulmonary neg pulmonary ROS,  breath sounds clear to auscultation  Pulmonary exam normal       Cardiovascular negative cardio ROS  Rhythm:Regular Rate:Normal     Neuro/Psych negative neurological ROS  negative psych ROS   GI/Hepatic negative GI ROS, Neg liver ROS,   Endo/Other  diabetes, Type 2, Oral Hypoglycemic Agents  Renal/GU negative Renal ROS  negative genitourinary   Musculoskeletal negative musculoskeletal ROS (+)   Abdominal   Peds negative pediatric ROS (+)  Hematology negative hematology ROS (+)   Anesthesia Other Findings   Reproductive/Obstetrics negative OB ROS                           Anesthesia Physical Anesthesia Plan  ASA: II  Anesthesia Plan: General   Post-op Pain Management:    Induction: Intravenous  Airway Management Planned: Oral ETT  Additional Equipment:   Intra-op Plan:   Post-operative Plan: Extubation in OR  Informed Consent: I have reviewed the patients History and Physical, chart, labs and discussed the procedure including the risks, benefits and alternatives for the proposed anesthesia with the patient or authorized representative who has indicated his/her understanding and acceptance.   Dental advisory given  Plan Discussed with: CRNA  Anesthesia Plan Comments: (SCB)        Anesthesia Quick Evaluation

## 2013-12-06 NOTE — Op Note (Signed)
NAMEEARVIN, Oscar Sampson NO.:  [id number]  MEDICAL RECORD NO.:  [id number]  LOCATION:                                 FACILITY:  PHYSICIAN:  Cynda Familia, M.D.DATE OF BIRTH:  [birth year]  DATE OF PROCEDURE:  12/05/2013 DATE OF DISCHARGE:  12/05/2013                              OPERATIVE REPORT   PREOPERATIVE DIAGNOSES: 1. Right shoulder labral tear. 2. Impingement syndrome. 3. Acromioclavicular arthritis. 4. Partial rotator cuff tear.  POSTOPERATIVE DIAGNOSES: 1. Right shoulder type 1 labral tear. 2. Impingement syndrome. 3. Acromioclavicular arthritis.  PROCEDURE: 1. Right shoulder glenohumeral arthroscopy with labral debridement. 2. Arthroscopic subacromial decompression with acromioplasty,     bursectomy, and coracoacromial ligament release. 3. Arthroscopic distal clavicle resection.  SURGEON:  Cynda Familia, M.D.  ASSISTANT:  Wyatt Portela, PA-C.  ANESTHESIA:  Interscalene block, general.  ESTIMATED BLOOD LOSS:  Less than 10 mL.  DRAINS:  None.  COMPLICATIONS:  None.  DISPOSITION:  PACU, stable.  OPERATIVE DETAILS:  The patient was counseled in the holding area. Correct site was identified.  IV started.  Sedation given.  Interscalene block was administered per anesthesiologist.  Taken to the operating room, placed in supine position.  General anesthesia, turned to left lateral decubitus position, properly padded and bumped.  Right shoulder was examined with full range of motion, stable.  Prepped with DuraPrep. Draped in sterile fashion.  We utilized open-end shoulder positioner at 30 degrees abduction, 10 degrees of forward flexion, 20 pounds longitudinal __________ larger size of his arm.  Time-out done, again confirmed the right side.  Posterior portal was created.  Arthroscope was placed to the glenohumeral joint.  Diagnostic arthroscopy of the type 1 labral tear superiorly and posteriorly and some mild biceps fraying  into portal made __________ through the rotator cuff interval. __________ and debrided the labrum to healthy tissue.  The biceps labral anchor was intact.  The __________ superior, inferior, and posterior labrum was intact on the labral debridement.  Attention was directed to the biceps.  Again, mild fraying __________ joint confirmed mild fraying.  No bicipital stenosis.  Synovium capsule, rotator cuff intact throughout __________ normal, normal stability __________ subacromial facet from the lateral portal.  Neurovascular structures were protected and include __________.  The bursal surface rotator cuff found to be intact.  ArthroCare system was utilized to __________ the CA ligament, and bur was then placed posteriorly, anterior-inferior acromioplasty was performed __________ flat acromion morphology anterior and laterally. Of note, he had marked compromise prior to this and was significantly decompressed afterwards, but made sure we did not remove excessive bone and did not __________ arthritic __________ placed anterior, lateral 5-8 mm of clavicle was removed circumferentially.  The __________ capsule was left intact.  The clavicle was palpated and found to be stable. Debris was removed.  Hemostasis was obtained.  There were no other abnormalities noted.  __________ scope was removed, taken out of traction.  The __________ portals closed __________ 10 mL of 0.25 Sensorcaine plus __________ supine, placed in sling, awakened, and taken from the operating to PACU in stable condition.  To help with patient positioning, prepping, draping, technical and surgical assistance throughout the entire case,  Mr. Wyatt Portela, [place]- C's, assistance was needed.  He will be stabilized in PACU then discharged to home.          ______________________________ Cynda Familia, M.D.     RAC/MEDQ  D:  12/05/2013  T:  12/06/2013  Job:  [id number]

## 2013-12-07 ENCOUNTER — Encounter (HOSPITAL_BASED_OUTPATIENT_CLINIC_OR_DEPARTMENT_OTHER): Payer: Self-pay | Admitting: Specialist

## 2013-12-07 NOTE — Anesthesia Postprocedure Evaluation (Signed)
Anesthesia Post Note  Patient: Oscar Sampson  Procedure(s) Performed: Procedure(s) (LRB): RGHT SHOULDER ARTHROSCOPY WITH SUBACROMIAL DECOMPRESSION,DISTAL CLAVICLE RESECTION, LABRAL DEBRIDEMENT  (Right)  Anesthesia type: General  Patient location: PACU  Post pain: Pain level controlled  Post assessment: Post-op Vital signs reviewed  Last Vitals:  Filed Vitals:   12/05/13 1845  BP: 128/79  Pulse: 98  Temp: 36.3 C  Resp: 16    Post vital signs: Reviewed  Level of consciousness: sedated  Complications: No apparent anesthesia complications

## 2014-06-15 HISTORY — PX: SHOULDER SURGERY: SHX246

## 2014-12-29 ENCOUNTER — Encounter (HOSPITAL_COMMUNITY): Payer: Self-pay | Admitting: Emergency Medicine

## 2014-12-29 ENCOUNTER — Emergency Department (HOSPITAL_COMMUNITY)
Admission: EM | Admit: 2014-12-29 | Discharge: 2014-12-29 | Disposition: A | Discharge status: Home / Self Care | Payer: Self-pay | Attending: Emergency Medicine | Admitting: Emergency Medicine

## 2014-12-29 ENCOUNTER — Emergency Department (HOSPITAL_COMMUNITY): Payer: Self-pay

## 2014-12-29 DIAGNOSIS — E119 Type 2 diabetes mellitus without complications: Secondary | ICD-10-CM | POA: Insufficient documentation

## 2014-12-29 DIAGNOSIS — R110 Nausea: Secondary | ICD-10-CM | POA: Insufficient documentation

## 2014-12-29 DIAGNOSIS — R071 Chest pain on breathing: Secondary | ICD-10-CM | POA: Insufficient documentation

## 2014-12-29 DIAGNOSIS — Z79899 Other long term (current) drug therapy: Secondary | ICD-10-CM | POA: Insufficient documentation

## 2014-12-29 DIAGNOSIS — R0602 Shortness of breath: Secondary | ICD-10-CM | POA: Insufficient documentation

## 2014-12-29 DIAGNOSIS — R05 Cough: Secondary | ICD-10-CM | POA: Insufficient documentation

## 2014-12-29 MED ORDER — IBUPROFEN 800 MG PO TABS
800.0000 mg | ORAL_TABLET | Freq: Once | ORAL | Status: AC
  Administered 2014-12-29: 800 mg via ORAL
  Filled 2014-12-29: qty 1

## 2014-12-29 NOTE — ED Provider Notes (Signed)
CSN: [id number]     Arrival date & time 12/29/14  0808 History   First MD Initiated Contact with Patient 12/29/14 0809     Chief Complaint  Patient presents with  . Abdominal Pain    right upper rib pain     Patient is a 44 y.o. male presenting with chest pain. The history is provided by the patient and a significant other.  Chest Pain Pain location:  R chest Pain quality: aching   Pain radiates to:  Does not radiate Pain radiates to the back: no   Pain severity:  Moderate Duration:  6 days Timing:  Intermittent Progression:  Worsening Chronicity:  New Relieved by:  Rest Worsened by:  Certain positions, deep breathing and movement Associated symptoms: cough, nausea and shortness of breath   Associated symptoms: no fever, no lower extremity edema and not vomiting   Associated symptoms comment:  Denies hemoptysis  Risk factors: diabetes mellitus   Risk factors: no coronary artery disease, no hypertension, no prior DVT/PE and no smoking   Patient reports right lower chest wall pain for 6 days No known injury No trauma He feels mild SOB He has mild cough  No h/o VTE No recent travel No surgery in past month (rotator cuff surgery over 3 months ago) He reports he is active and walks frequently without issue He reports some residual right shoulder pain after surgery Past Medical History  Diagnosis Date  . Diabetes mellitus 2012  . At risk for sleep apnea     STOP-BANG= 4    SENT TO PCP  12-01-2013   Past Surgical History  Procedure Laterality Date  . Shoulder arthroscopy with subacromial decompression, rotator cuff repair and bicep tendon repair Right 12/05/2013    Procedure: RGHT SHOULDER ARTHROSCOPY WITH SUBACROMIAL DECOMPRESSION,DISTAL CLAVICLE RESECTION, LABRAL DEBRIDEMENT ;  Surgeon: Sydnee Cabal, MD;  Location: [place];  Service: Orthopedics;  Laterality: Right;   History reviewed. No pertinent family history. History  Substance Use Topics  .  Smoking status: Never Smoker   . Smokeless tobacco: Not on file  . Alcohol Use: Yes    Review of Systems  Constitutional: Negative for fever.  Respiratory: Positive for cough and shortness of breath.   Cardiovascular: Positive for chest pain.  Gastrointestinal: Positive for nausea. Negative for vomiting.  All other systems reviewed and are negative.     Allergies  Review of patient's allergies indicates no known allergies.  Home Medications   Prior to Admission medications   Medication Sig Start Date End Date Taking? Authorizing Provider  naphazoline-pheniramine (NAPHCON-A) 0.025-0.3 % ophthalmic solution Place 1 drop into both eyes every 4 (four) hours as needed. 01/21/13   John-Adam Bonk, MD  oxyCODONE-acetaminophen (ROXICET) 5-325 MG per tablet Take 1-2 tablets by mouth every 4 (four) hours as needed for severe pain. 12/05/13   Bryson L Stilwell, PA-C  sitaGLIPtan-metformin (JANUMET) 50-1000 MG per tablet Take 1 tablet by mouth 2 (two) times daily with a meal.    Historical Provider, MD   BP 130/85 mmHg  Pulse 82  Temp(Src) 97.5 F (36.4 C) (Oral)  Resp 121  Ht 5\' 11"  (1.803 m)  Wt 240 lb (108.863 kg)  BMI 33.49 kg/m2  SpO2 99% Physical Exam CONSTITUTIONAL: Well developed/well nourished HEAD: Normocephalic/atraumatic EYES: EOMI/PERRL ENMT: Mucous membranes moist NECK: supple no meningeal signs SPINE/BACK:entire spine nontender CV: S1/S2 noted, no murmurs/rubs/gallops noted Chest - mild tenderness to right lower costal margin. No crepitus/bruising LUNGS: Lungs are clear to auscultation  bilaterally, no apparent distress ABDOMEN: soft, nontender, no rebound or guarding, bowel sounds noted throughout abdomen. No RUQ tenderness GU:no cva tenderness NEURO: Pt is awake/alert/appropriate, moves all extremitiesx4.  No facial droop.   EXTREMITIES: pulses normal/equal, full ROM, no LE edema or calf tenderness.  Well healed incisions on right shoulder SKIN: warm, color  normal PSYCH: no abnormalities of mood noted, alert and oriented to situation  ED Course  Procedures  8:35 AM Pt here with right chest wall pain. No abd tenderness to suggest cholelithiasis or other acute issue.   He appears PERC negative (initial vitals listed Resp rate at 121 which was error as pt in no distress) Will obtain EKG/CXR Pt agreeable 9:17 AM Workup negative Pt well appearing I doubt ACS/PE/Dissection at this time given history/exam/vitals We discussed strict ER return precautions  Imaging Review Dg Chest 2 View  12/29/2014   CLINICAL DATA:  Pt states right side CP and SOB x 1 week. Pt denies any injury. Non-smoker.  EXAM: CHEST  2 VIEW  COMPARISON:  11/29/2011  FINDINGS: The heart size and mediastinal contours are within normal limits. Both lungs are clear. No pleural effusion or pneumothorax. The visualized skeletal structures are unremarkable.  IMPRESSION: No active cardiopulmonary disease.   Electronically Signed   By: Lajean Manes M.D.   On: 12/29/2014 08:46     EKG Interpretation   Date/Time:  Saturday December 29 2014 08:32:48 EDT Ventricular Rate:  87 PR Interval:  151 QRS Duration: 87 QT Interval:  349 QTC Calculation: 420 R Axis:   40 Text Interpretation:  Sinus rhythm Nonspecific T abnormalities, inferior  leads No significant change since last tracing Confirmed by Christy Gentles  MD,  Ryver Zadrozny ([id number]) on 12/29/2014 8:41:10 AM     Medications  ibuprofen (ADVIL,MOTRIN) tablet 800 mg (800 mg Oral Given 12/29/14 8325)    MDM   Final diagnoses:  Chest pain on breathing    Nursing notes including past medical history and social history reviewed and considered in documentation xrays/imaging reviewed by myself and considered during evaluation     Ripley Fraise, MD 12/29/14 [phone]

## 2014-12-29 NOTE — ED Notes (Signed)
Dr. Wickline at bedside.  

## 2014-12-29 NOTE — Discharge Instructions (Signed)

## 2014-12-29 NOTE — ED Notes (Signed)
Pt states he has been having right upper rib pain since last Saturday with nausea. Pt has been taken nexium with no relief. Pt A&OX4, NAD noted. Pt has h/o DM type II, denies hypertension. VS stable. Pt had rotator cuff repair in March 2016.

## 2015-01-05 ENCOUNTER — Inpatient Hospital Stay (HOSPITAL_COMMUNITY)
Admission: EM | Admit: 2015-01-05 | Discharge: 2015-01-07 | DRG: 194 | Disposition: A | Discharge status: Home / Self Care | Payer: Self-pay | Attending: Internal Medicine | Admitting: Internal Medicine

## 2015-01-05 ENCOUNTER — Encounter (HOSPITAL_COMMUNITY): Payer: Self-pay | Admitting: Emergency Medicine

## 2015-01-05 ENCOUNTER — Emergency Department (HOSPITAL_COMMUNITY): Payer: Self-pay

## 2015-01-05 DIAGNOSIS — Z79899 Other long term (current) drug therapy: Secondary | ICD-10-CM

## 2015-01-05 DIAGNOSIS — R748 Abnormal levels of other serum enzymes: Secondary | ICD-10-CM | POA: Diagnosis present

## 2015-01-05 DIAGNOSIS — N179 Acute kidney failure, unspecified: Secondary | ICD-10-CM | POA: Diagnosis present

## 2015-01-05 DIAGNOSIS — R7989 Other specified abnormal findings of blood chemistry: Secondary | ICD-10-CM

## 2015-01-05 DIAGNOSIS — IMO0002 Reserved for concepts with insufficient information to code with codable children: Secondary | ICD-10-CM

## 2015-01-05 DIAGNOSIS — D72829 Elevated white blood cell count, unspecified: Secondary | ICD-10-CM

## 2015-01-05 DIAGNOSIS — R778 Other specified abnormalities of plasma proteins: Secondary | ICD-10-CM

## 2015-01-05 DIAGNOSIS — R911 Solitary pulmonary nodule: Secondary | ICD-10-CM | POA: Diagnosis present

## 2015-01-05 DIAGNOSIS — K760 Fatty (change of) liver, not elsewhere classified: Secondary | ICD-10-CM | POA: Diagnosis present

## 2015-01-05 DIAGNOSIS — E1165 Type 2 diabetes mellitus with hyperglycemia: Secondary | ICD-10-CM

## 2015-01-05 DIAGNOSIS — J189 Pneumonia, unspecified organism: Principal | ICD-10-CM | POA: Diagnosis present

## 2015-01-05 LAB — COMPREHENSIVE METABOLIC PANEL
ALBUMIN: 4 g/dL (ref 3.5–5.0)
ALK PHOS: 73 U/L (ref 38–126)
ALT: 107 U/L — ABNORMAL HIGH (ref 17–63)
AST: 64 U/L — AB (ref 15–41)
Anion gap: 9 (ref 5–15)
BILIRUBIN TOTAL: 1 mg/dL (ref 0.3–1.2)
BUN: 8 mg/dL (ref 6–20)
CHLORIDE: 101 mmol/L (ref 101–111)
CO2: 26 mmol/L (ref 22–32)
CREATININE: 1.44 mg/dL — AB (ref 0.61–1.24)
Calcium: 9.4 mg/dL (ref 8.9–10.3)
GFR calc Af Amer: >60 mL/min (ref >60)
GFR calc non Af Amer: 58 mL/min — ABNORMAL LOW (ref >60)
Glucose, Bld: 284 mg/dL — ABNORMAL HIGH (ref 65–99)
Potassium: 4.1 mmol/L (ref 3.5–5.1)
SODIUM: 136 mmol/L (ref 135–145)
Total Protein: 7.4 g/dL (ref 6.5–8.1)

## 2015-01-05 LAB — CBC WITH DIFFERENTIAL/PLATELET
BASOS ABS: 0 10*3/uL (ref 0.0–0.1)
BASOS PCT: 0 % (ref 0–1)
BASOS PCT: 0 % (ref 0–1)
Basophils Absolute: 0 10*3/uL (ref 0.0–0.1)
EOS ABS: 0 10*3/uL (ref 0.0–0.7)
EOS ABS: 0 10*3/uL (ref 0.0–0.7)
EOS PCT: 0 % (ref 0–5)
Eosinophils Relative: 0 % (ref 0–5)
HCT: 39.5 % (ref 39.0–52.0)
HCT: 38.8 % — ABNORMAL LOW (ref 39.0–52.0)
HEMOGLOBIN: 13.8 g/dL (ref 13.0–17.0)
HEMOGLOBIN: 13.6 g/dL (ref 13.0–17.0)
Lymphocytes Relative: 7 % — ABNORMAL LOW (ref 12–46)
Lymphocytes Relative: 7 % — ABNORMAL LOW (ref 12–46)
Lymphs Abs: 1.2 10*3/uL (ref 0.7–4.0)
Lymphs Abs: 1.2 10*3/uL (ref 0.7–4.0)
MCH: 28.7 pg (ref 26.0–34.0)
MCH: 28.8 pg (ref 26.0–34.0)
MCHC: 34.9 g/dL (ref 30.0–36.0)
MCHC: 35.1 g/dL (ref 30.0–36.0)
MCV: 82.1 fL (ref 78.0–100.0)
MCV: 82.2 fL (ref 78.0–100.0)
Monocytes Absolute: 1.2 10*3/uL — ABNORMAL HIGH (ref 0.1–1.0)
Monocytes Absolute: 1.2 10*3/uL — ABNORMAL HIGH (ref 0.1–1.0)
Monocytes Relative: 7 % (ref 3–12)
Monocytes Relative: 7 % (ref 3–12)
NEUTROS ABS: 15.5 10*3/uL — AB (ref 1.7–7.7)
NEUTROS PCT: 86 % — AB (ref 43–77)
NEUTROS PCT: 86 % — AB (ref 43–77)
Neutro Abs: 14.8 10*3/uL — ABNORMAL HIGH (ref 1.7–7.7)
PLATELETS: 242 10*3/uL (ref 150–400)
PLATELETS: 224 10*3/uL (ref 150–400)
RBC: 4.81 MIL/uL (ref 4.22–5.81)
RBC: 4.72 MIL/uL (ref 4.22–5.81)
RDW: 14.3 % (ref 11.5–15.5)
RDW: 14.4 % (ref 11.5–15.5)
WBC: 17.9 10*3/uL — ABNORMAL HIGH (ref 4.0–10.5)
WBC: 17.2 10*3/uL — ABNORMAL HIGH (ref 4.0–10.5)

## 2015-01-05 LAB — URINALYSIS, ROUTINE W REFLEX MICROSCOPIC
Bilirubin Urine: NEGATIVE
Glucose, UA: >1000 mg/dL — AB
Hgb urine dipstick: NEGATIVE
KETONES UR: 15 mg/dL — AB
LEUKOCYTES UA: NEGATIVE
NITRITE: NEGATIVE
PH: 6.5 (ref 5.0–8.0)
Protein, ur: NEGATIVE mg/dL
Specific Gravity, Urine: 1.019 (ref 1.005–1.030)
Urobilinogen, UA: 1 mg/dL (ref 0.0–1.0)

## 2015-01-05 LAB — GLUCOSE, CAPILLARY
GLUCOSE-CAPILLARY: 234 mg/dL — AB (ref 65–99)
Glucose-Capillary: 247 mg/dL — ABNORMAL HIGH (ref 65–99)

## 2015-01-05 LAB — TROPONIN I
Troponin I: 0.09 ng/mL — ABNORMAL HIGH (ref <0.031)
Troponin I: <0.03 ng/mL (ref <0.031)

## 2015-01-05 LAB — D-DIMER, QUANTITATIVE (NOT AT ARMC): D DIMER QUANT: 0.31 ug{FEU}/mL (ref 0.00–0.48)

## 2015-01-05 LAB — LIPASE, BLOOD: Lipase: 28 U/L (ref 22–51)

## 2015-01-05 LAB — URINE MICROSCOPIC-ADD ON

## 2015-01-05 LAB — I-STAT CG4 LACTIC ACID, ED: LACTIC ACID, VENOUS: 1.58 mmol/L (ref 0.5–2.0)

## 2015-01-05 MED ORDER — DEXTROSE 5 % IV SOLN
500.0000 mg | INTRAVENOUS | Status: DC
  Filled 2015-01-05: qty 500

## 2015-01-05 MED ORDER — DEXTROSE 5 % IV SOLN
1.0000 g | INTRAVENOUS | Status: DC
  Filled 2015-01-05: qty 10

## 2015-01-05 MED ORDER — POLYETHYLENE GLYCOL 3350 17 G PO PACK: 17.0000 g | PACK | Freq: Every day | ORAL | Status: DC | PRN

## 2015-01-05 MED ORDER — SODIUM CHLORIDE 0.9 % IV BOLUS (SEPSIS)
1000.0000 mL | Freq: Once | INTRAVENOUS | Status: AC
  Administered 2015-01-05: 1000 mL via INTRAVENOUS

## 2015-01-05 MED ORDER — ENOXAPARIN SODIUM 40 MG/0.4ML ~~LOC~~ SOLN
40.0000 mg | SUBCUTANEOUS | Status: DC
  Administered 2015-01-05 – 2015-01-06 (×2): 40 mg via SUBCUTANEOUS
  Filled 2015-01-05 (×3): qty 0.4

## 2015-01-05 MED ORDER — MORPHINE SULFATE 4 MG/ML IJ SOLN
4.0000 mg | Freq: Once | INTRAMUSCULAR | Status: AC
  Administered 2015-01-05: 4 mg via INTRAVENOUS
  Filled 2015-01-05: qty 1

## 2015-01-05 MED ORDER — IOHEXOL 300 MG/ML  SOLN
100.0000 mL | Freq: Once | INTRAMUSCULAR | Status: AC | PRN
  Administered 2015-01-05: 100 mL via INTRAVENOUS

## 2015-01-05 MED ORDER — INSULIN ASPART 100 UNIT/ML ~~LOC~~ SOLN
0.0000 [IU] | Freq: Three times a day (TID) | SUBCUTANEOUS | Status: DC
  Administered 2015-01-05 – 2015-01-06 (×4): 3 [IU] via SUBCUTANEOUS
  Administered 2015-01-07: 5 [IU] via SUBCUTANEOUS
  Administered 2015-01-07 (×2): 2 [IU] via SUBCUTANEOUS

## 2015-01-05 MED ORDER — ASPIRIN 81 MG PO CHEW
324.0000 mg | CHEWABLE_TABLET | Freq: Once | ORAL | Status: AC
  Administered 2015-01-05: 324 mg via ORAL
  Filled 2015-01-05: qty 4

## 2015-01-05 MED ORDER — ALBUTEROL SULFATE (2.5 MG/3ML) 0.083% IN NEBU
2.5000 mg | INHALATION_SOLUTION | RESPIRATORY_TRACT | Status: DC | PRN
  Administered 2015-01-05 – 2015-01-07 (×5): 2.5 mg via RESPIRATORY_TRACT
  Filled 2015-01-05 (×5): qty 3

## 2015-01-05 MED ORDER — SODIUM CHLORIDE 0.9 % IV SOLN
INTRAVENOUS | Status: DC
  Administered 2015-01-05: 125 mL/h via INTRAVENOUS
  Administered 2015-01-06: 10:00:00 via INTRAVENOUS
  Administered 2015-01-07: 75 mL/h via INTRAVENOUS

## 2015-01-05 MED ORDER — CEFTRIAXONE SODIUM IN DEXTROSE 20 MG/ML IV SOLN
1.0000 g | INTRAVENOUS | Status: DC
  Administered 2015-01-06 – 2015-01-07 (×2): 1 g via INTRAVENOUS
  Filled 2015-01-05 (×2): qty 50

## 2015-01-05 MED ORDER — MORPHINE SULFATE 2 MG/ML IJ SOLN
1.0000 mg | INTRAMUSCULAR | Status: DC | PRN
  Administered 2015-01-06: 1 mg via INTRAVENOUS
  Filled 2015-01-05: qty 1

## 2015-01-05 MED ORDER — GUAIFENESIN ER 600 MG PO TB12
600.0000 mg | ORAL_TABLET | Freq: Two times a day (BID) | ORAL | Status: DC
  Administered 2015-01-05 – 2015-01-07 (×5): 600 mg via ORAL
  Filled 2015-01-05 (×7): qty 1

## 2015-01-05 MED ORDER — DEXTROSE 5 % IV SOLN
500.0000 mg | Freq: Once | INTRAVENOUS | Status: AC
  Administered 2015-01-05: 500 mg via INTRAVENOUS
  Filled 2015-01-05: qty 500

## 2015-01-05 MED ORDER — SODIUM CHLORIDE 0.9 % IJ SOLN
3.0000 mL | Freq: Two times a day (BID) | INTRAMUSCULAR | Status: DC
  Administered 2015-01-05 – 2015-01-06 (×2): 3 mL via INTRAVENOUS

## 2015-01-05 MED ORDER — ONDANSETRON HCL 4 MG/2ML IJ SOLN: 4.0000 mg | Freq: Four times a day (QID) | INTRAMUSCULAR | Status: DC | PRN

## 2015-01-05 MED ORDER — IOHEXOL 300 MG/ML  SOLN
25.0000 mL | Freq: Once | INTRAMUSCULAR | Status: AC | PRN
  Administered 2015-01-05: 25 mL via ORAL

## 2015-01-05 MED ORDER — DEXTROSE 5 % IV SOLN
500.0000 mg | INTRAVENOUS | Status: DC
  Administered 2015-01-06 – 2015-01-07 (×2): 500 mg via INTRAVENOUS
  Filled 2015-01-05 (×2): qty 500

## 2015-01-05 MED ORDER — ONDANSETRON HCL 4 MG PO TABS
4.0000 mg | ORAL_TABLET | Freq: Four times a day (QID) | ORAL | Status: DC | PRN
Start: 2015-01-05 — End: 2015-01-07

## 2015-01-05 MED ORDER — ACETAMINOPHEN 650 MG RE SUPP: 650.0000 mg | Freq: Four times a day (QID) | RECTAL | Status: DC | PRN

## 2015-01-05 MED ORDER — ENOXAPARIN SODIUM 40 MG/0.4ML ~~LOC~~ SOLN: 40.0000 mg | SUBCUTANEOUS | Status: DC

## 2015-01-05 MED ORDER — HYDROCODONE-ACETAMINOPHEN 5-325 MG PO TABS
1.0000 | ORAL_TABLET | ORAL | Status: DC | PRN
  Administered 2015-01-06: 1 via ORAL
  Administered 2015-01-06: 2 via ORAL
  Filled 2015-01-05: qty 2
  Filled 2015-01-05: qty 1

## 2015-01-05 MED ORDER — DEXTROSE 5 % IV SOLN
1.0000 g | Freq: Once | INTRAVENOUS | Status: AC
  Administered 2015-01-05: 1 g via INTRAVENOUS
  Filled 2015-01-05: qty 10

## 2015-01-05 MED ORDER — ACETAMINOPHEN 325 MG PO TABS
650.0000 mg | ORAL_TABLET | Freq: Four times a day (QID) | ORAL | Status: DC | PRN
  Administered 2015-01-05: 650 mg via ORAL
  Filled 2015-01-05: qty 2

## 2015-01-05 NOTE — Progress Notes (Signed)
Patient admitted from ED to 6e10. Spouse at bedside. Admission completed. Telemetry #10 placed on patient. ST 111. Tachypneic at 40. Temp 100.9. Patient c/o wheezing. Slight wheezing noted on auscultation in R upper lobe. MD notified. MD ordered to give tylenol. Albuterol given per prn order. HR up to 120s-140s. O2 sat 98% RA. O2 placed per MD order for patient comfort  No skin problems. Will continue to monitor. Bartholomew Crews, RN .

## 2015-01-05 NOTE — ED Provider Notes (Signed)
CSN: [id number]     Arrival date & time 01/05/15  1054 History   First MD Initiated Contact with Patient 01/05/15 1120     Chief Complaint  Patient presents with  . Shortness of Breath  . Fever     (Consider location/radiation/quality/duration/timing/severity/associated sxs/prior Treatment) Patient is a 44 y.o. male presenting with cough.  Cough Cough characteristics:  Dry Severity:  Severe Onset quality:  Gradual Duration:  1 week Timing:  Constant Progression:  Worsening Chronicity:  New Relieved by:  Nothing Worsened by:  Nothing tried Ineffective treatments: theraflu, ibuprofen. Associated symptoms: chest pain (right lower chest lateral and right chest), chills, fever (started yesterday 102), headaches and shortness of breath (2wk, getting worse)   Associated symptoms: no ear pain, no rash, no rhinorrhea and no sore throat   Risk factors: no recent travel     Past Medical History  Diagnosis Date  . Diabetes mellitus 2012  . At risk for sleep apnea     STOP-BANG= 4    SENT TO PCP  12-01-2013   Past Surgical History  Procedure Laterality Date  . Shoulder arthroscopy with subacromial decompression, rotator cuff repair and bicep tendon repair Right 12/05/2013    Procedure: RGHT SHOULDER ARTHROSCOPY WITH SUBACROMIAL DECOMPRESSION,DISTAL CLAVICLE RESECTION, LABRAL DEBRIDEMENT ;  Surgeon: Sydnee Cabal, MD;  Location: [place];  Service: Orthopedics;  Laterality: Right;   No family history on file. History  Substance Use Topics  . Smoking status: Never Smoker   . Smokeless tobacco: Not on file  . Alcohol Use: Yes    Review of Systems  Constitutional: Positive for fever (started yesterday 102), chills, activity change, appetite change and fatigue.  HENT: Negative for congestion, ear pain, rhinorrhea and sore throat.   Eyes: Negative for visual disturbance.  Respiratory: Positive for cough (1wk cough) and shortness of breath (2wk, getting worse).    Cardiovascular: Positive for chest pain (right lower chest lateral and right chest). Negative for leg swelling.  Gastrointestinal: Positive for nausea. Negative for vomiting, diarrhea, constipation, blood in stool and anal bleeding.  Genitourinary: Positive for flank pain. Negative for frequency and difficulty urinating.  Musculoskeletal: Positive for arthralgias (chronic hand pain).  Skin: Negative for rash.  Neurological: Positive for headaches. Negative for syncope, weakness and numbness.      Allergies  Review of patient's allergies indicates no known allergies.  Home Medications   Prior to Admission medications   Medication Sig Start Date End Date Taking? Authorizing Provider  naphazoline-pheniramine (NAPHCON-A) 0.025-0.3 % ophthalmic solution Place 1 drop into both eyes every 4 (four) hours as needed. Patient not taking: Reported on 12/29/2014 01/21/13   Rhunette Croft, MD  oxyCODONE-acetaminophen (ROXICET) 5-325 MG per tablet Take 1-2 tablets by mouth every 4 (four) hours as needed for severe pain. Patient not taking: Reported on 12/29/2014 12/05/13   Bryson L Stilwell, PA-C  sitaGLIPtan-metformin (JANUMET) 50-1000 MG per tablet Take 1 tablet by mouth 2 (two) times daily with a meal.    Historical Provider, MD   BP 173/83 mmHg  Pulse 128  Temp(Src) 100.6 F (38.1 C) (Oral)  Resp 17  Ht 5\' 11"  (1.803 m)  Wt 240 lb (108.863 kg)  BMI 33.49 kg/m2  SpO2 94% Physical Exam  Constitutional: He is oriented to person, place, and time. He appears well-developed and well-nourished. No distress.  HENT:  Head: Normocephalic and atraumatic.  Eyes: Conjunctivae and EOM are normal.  Neck: Normal range of motion.  Cardiovascular: Regular rhythm, normal heart sounds  and intact distal pulses.  Tachycardia present.  Exam reveals no gallop and no friction rub.   No murmur heard. Pulmonary/Chest: Effort normal and breath sounds normal. No respiratory distress. He has no wheezes. He has no rales.   Abdominal: Soft. He exhibits no distension. There is tenderness in the right upper quadrant and right lower quadrant. There is CVA tenderness, tenderness at McBurney's point and positive Murphy's sign. There is no guarding.  Musculoskeletal: He exhibits no edema.  Neurological: He is alert and oriented to person, place, and time.  Skin: Skin is warm and dry. He is not diaphoretic.  Nursing note and vitals reviewed.   ED Course  Procedures (including critical care time) Labs Review Labs Reviewed  CBC WITH DIFFERENTIAL/PLATELET  BASIC METABOLIC PANEL    Imaging Review No results found.   EKG Interpretation None      MDM   Final diagnoses:  None   45yo male with history of DM, recent evaluation for right sided chest pain one week ago presents with concern of shortness of breath, cough, fever and chest and abdominal pain.  Patient's temperature is 100.6 on arrival to the emergency department with a pulse of 128.  Given patient's diffuse areas of pain including chest as well as abdomen and flank differential diagnosis is large and includes pulmonary embolus, pneumonia, cholecystitis, pyelonephritis, UTI with nephrolithiasis, appendicitis.  Patient does not have any risk factors for pulmonary embolus and is low risk [place] with a negative d-dimer giving decreased suspicion for PE.  X-ray does show hazy opacity concerning for possible pneumonia. Patient was given Rocephin and azithromycin for concern of community-acquired pneumonia.  PSI/PORT score is 83.    He also began complaining of significant abdominal and right flank pain as well as tenderness in the right lower quadrant. Given diffuse areas of pain and tenderness on exam a CT abdomen and pelvis was done to evaluate for other intra-abdominal pathology and showed nonobstructing nephrolithiasis, hepatomegaly, and no acute intraabdominal findings.  Troponin ordered given chest pain and positive, likely secondary to tachycardia/acute  illness. Aspirin given.  Will admit patient for pneumonia with positive troponin for continued evaluation.     Gareth Morgan, MD 01/05/15 2052

## 2015-01-05 NOTE — ED Notes (Signed)
Attempted report 

## 2015-01-05 NOTE — ED Notes (Signed)
Pt. Stated, I was here a week ago with the same symptoms except feeling a little worse with fever.

## 2015-01-05 NOTE — ED Notes (Signed)
Patient brought back and hooked up. Patient states Pain 10/10 on Right side and requested pain Medication. MD made aware.

## 2015-01-05 NOTE — ED Notes (Signed)
Pt. Ibuprofen 600mg  at 1000

## 2015-01-05 NOTE — ED Notes (Signed)
MD made aware patient request for pain medication.

## 2015-01-05 NOTE — ED Notes (Signed)
Spoke with Oscar Sampson in lab states I stat is not run states he can not find tubes.

## 2015-01-05 NOTE — H&P (Addendum)
[facility] History and Physical  Oscar Sampson GXQ:[id number] DOB: 11/18/1970 DOA: 01/05/2015  Referring physician: Dr Billy Fischer PCP: Tommy Medal, MD   Chief Complaint: SOB  HPI: Oscar Sampson is a 44 y.o. male with PMH significant for Diabetes, rotator cuff surgery, who presents complaining of persistent dyspnea, cough, chest pain, right side that started 1 week ago. He was seeing in the ED on th 17, and discharge home. He presents with persistent  fever, dry cough. Chest pain is pleuritic, worse with deep breath and after he cough. He denies diarrhea, vomiting. Does relates nausea.   Evaluation in the ED; D dimer negative, WBC at 17. CT abdomen: showed right Lower lobe PNA. Cr at 1.44. troponin mildly elevated at 0.09  Review of Systems:  Negative except as per  HPI.    Past Medical History  Diagnosis Date  . Diabetes mellitus 2012  . At risk for sleep apnea     STOP-BANG= 4    SENT TO PCP  12-01-2013   Past Surgical History  Procedure Laterality Date  . Shoulder arthroscopy with subacromial decompression, rotator cuff repair and bicep tendon repair Right 12/05/2013    Procedure: RGHT SHOULDER ARTHROSCOPY WITH SUBACROMIAL DECOMPRESSION,DISTAL CLAVICLE RESECTION, LABRAL DEBRIDEMENT ;  Surgeon: Sydnee Cabal, MD;  Location: [place];  Service: Orthopedics;  Laterality: Right;   Social History:  reports that he has never smoked. He does not have any smokeless tobacco history on file. He reports that he drinks alcohol. He reports that he does not use illicit drugs.  Allergies  Allergen Reactions  . Neosporin [Neomycin-Bacitracin Zn-Polymyx] Hives and Swelling    Family History: Mother; diabetes, HTN,   Prior to Admission medications   Medication Sig Start Date End Date Taking? Authorizing Provider  sitaGLIPtan-metformin (JANUMET) 50-1000 MG per tablet Take 1 tablet by mouth 2 (two) times daily with a meal.   Yes Historical Provider, MD    naphazoline-pheniramine (NAPHCON-A) 0.025-0.3 % ophthalmic solution Place 1 drop into both eyes every 4 (four) hours as needed. Patient not taking: Reported on 12/29/2014 01/21/13   Rhunette Croft, MD  oxyCODONE-acetaminophen (ROXICET) 5-325 MG per tablet Take 1-2 tablets by mouth every 4 (four) hours as needed for severe pain. Patient not taking: Reported on 12/29/2014 12/05/13   Lajean Manes, PA-C   Physical Exam: Filed Vitals:   01/05/15 1500 01/05/15 1515 01/05/15 1530 01/05/15 1545  BP: 163/82 168/83 152/80 147/80  Pulse: 101 100 109 109  Temp:      TempSrc:      Resp:      Height:      Weight:      SpO2: 98% 98% 96% 98%    Wt Readings from Last 3 Encounters:  01/05/15 108.863 kg (240 lb)  12/29/14 108.863 kg (240 lb)  12/05/13 105.235 kg (232 lb)    General:  Appears calm and comfortable Eyes: PERRL, normal lids, irises & conjunctiva ENT: grossly normal hearing, lips & tongue Neck: no LAD, masses or thyromegaly Cardiovascular: RRR, no m/r/g. No LE edema. Telemetry: Sinus tachycardia Respiratory: CTA bilaterally, no w/r/r. Normal respiratory effort. Abdomen: soft, right upper quadrant pain.  Skin: no rash or induration seen on limited exam Musculoskeletal: grossly normal tone BUE/BLE Psychiatric: grossly normal mood and affect, speech fluent and appropriate Neurologic: grossly non-focal.          Labs on Admission:  Basic Metabolic Panel:  Recent Labs Lab 01/05/15 1200  NA 136  K 4.1  CL 101  CO2 26  GLUCOSE 284*  BUN 8  CREATININE 1.44*  CALCIUM 9.4   Liver Function Tests:  Recent Labs Lab 01/05/15 1200  AST 64*  ALT 107*  ALKPHOS 73  BILITOT 1.0  PROT 7.4  ALBUMIN 4.0    Recent Labs Lab 01/05/15 1200  LIPASE 28   No results for input(s): AMMONIA in the last 168 hours. CBC:  Recent Labs Lab 01/05/15 1110 01/05/15 1200  WBC 17.9* 17.2*  NEUTROABS 15.5* 14.8*  HGB 13.8 13.6  HCT 39.5 38.8*  MCV 82.1 82.2  PLT 242 224    Cardiac Enzymes:  Recent Labs Lab 01/05/15 1200  TROPONINI 0.09*    BNP (last 3 results) No results for input(s): BNP in the last 8760 hours.  ProBNP (last 3 results) No results for input(s): PROBNP in the last 8760 hours.  CBG: No results for input(s): GLUCAP in the last 168 hours.  Radiological Exams on Admission: Dg Chest 2 View  01/05/2015   CLINICAL DATA:  Right lower chest pain, cough and congestion  EXAM: CHEST  2 VIEW  COMPARISON:  12/29/2014  FINDINGS: Right basilar airspace opacity overlies the right hemidiaphragm and is posterior on the lateral view compatible with right basilar pneumonia. Left lung remains clear. No effusion or pneumothorax. Normal heart size and vascularity. Trachea midline.  IMPRESSION: Posterior right basilar pneumonia.   Electronically Signed   By: Jerilynn Mages.  Shick M.D.   On: 01/05/2015 13:29   Ct Abdomen Pelvis W Contrast  01/05/2015   CLINICAL DATA:  Right lower chest, right flank, and right lower quadrant abdominal pain for 1 week. Fever yesterday.  EXAM: CT ABDOMEN AND PELVIS WITH CONTRAST  TECHNIQUE: Multidetector CT imaging of the abdomen and pelvis was performed using the standard protocol following bolus administration of intravenous contrast.  CONTRAST:  147mL OMNIPAQUE IOHEXOL 300 MG/ML  SOLN  COMPARISON:  Chest radiographs earlier today  FINDINGS: Right lower lobe airspace consolidation is present as seen on recent chest radiographs. There are 3 mm nodules in the right lower lobe (series 3, image 2), right middle lobe (image 1), and a left lower lobe (image 11). There is no pleural effusion.  The markedly decreased attenuation of the liver is consistent with prominent steatosis. Focal fatty sparing is noted along the gallbladder fossa. The liver is enlarged, measuring 23-24 cm in length. The gallbladder, spleen, adrenal glands, left kidney, and pancreas have an unremarkable enhanced appearance. There are 2 small nonobstructing right renal calculi, the  larger measuring 3 mm in the lower pole.  Oral contrast is present in nondilated loops of small bowel without evidence of obstruction. The appendix is identified in the right lower quadrant and is unremarkable.  Bladder is unremarkable. No free fluid or enlarged lymph nodes are identified. No acute osseous abnormality is identified.  IMPRESSION: 1. Right lower lobe pneumonia. 2. No evidence of acute abnormality in the abdomen or pelvis. 3. Enlarged, fatty liver. 4. Nonobstructing right nephrolithiasis. 5. 3 mm basilar lung nodules. If the patient is at high risk for bronchogenic carcinoma, follow-up chest CT at 1 year is recommended. If the patient is at low risk, no follow-up is needed. This recommendation follows the consensus statement: Guidelines for Management of Small Pulmonary Nodules Detected on CT Scans: A Statement from the [place] as published in Radiology 2005; 237:395-400.   Electronically Signed   By: Logan Bores   On: 01/05/2015 14:56    EKG: Independently reviewed. Sinus tachycardia, T wave inversion V 4, 5, 6 Lead II  Assessment/Plan Principal Problem:   CAP (community acquired pneumonia) Active Problems:   Leukocytosis   Diabetes type 2, uncontrolled   Elevated troponin I level   1-PNA:  Patient presents with fever, dry cough, pleuritic chest pain.  CT abdomen confirm Right lower lobe PNA.  Continue with Ceftriaxone, Azithromycin.  Follow Blood culture/   2-AKI: last cr per records 1.2 2014.  Continue with IV fluids.   3-Leukocytosis:  In setting of PNA.  Continue with IV antibiotics.   4-Diabetes type 2 uncontrolled;  Hold metformin.  SSI.   5-Lung nodule:  Needs repeat CT chest in 1 year.   6-Transaminases: fatty liver.  Follow up outpatient.   7-Screening for HIV.   8-Elevated troponin: EKG with T wave inversion, present last EKG.  Cycle enzymes. Check ECHO.   Code Status: full code.  DVT Prophylaxis: Lovenox.  Family Communication; Care  discussed with patient.  Disposition Plan: expect 3 to 4 days inpatient.   Time spent: 75 minutes.   Niel Hummer A [facility] Pager [phone]

## 2015-01-05 NOTE — Progress Notes (Signed)
Utilization Review completed. Rucha Wissinger RN BSN CM 

## 2015-01-06 DIAGNOSIS — R7989 Other specified abnormal findings of blood chemistry: Secondary | ICD-10-CM

## 2015-01-06 LAB — COMPREHENSIVE METABOLIC PANEL
ALK PHOS: 69 U/L (ref 38–126)
ALT: 108 U/L — AB (ref 17–63)
ANION GAP: 7 (ref 5–15)
AST: 65 U/L — ABNORMAL HIGH (ref 15–41)
Albumin: 3.2 g/dL — ABNORMAL LOW (ref 3.5–5.0)
BILIRUBIN TOTAL: 0.9 mg/dL (ref 0.3–1.2)
BUN: 8 mg/dL (ref 6–20)
CHLORIDE: 104 mmol/L (ref 101–111)
CO2: 26 mmol/L (ref 22–32)
Calcium: 8.8 mg/dL — ABNORMAL LOW (ref 8.9–10.3)
Creatinine, Ser: 1.27 mg/dL — ABNORMAL HIGH (ref 0.61–1.24)
GFR calc non Af Amer: >60 mL/min (ref >60)
GLUCOSE: 241 mg/dL — AB (ref 65–99)
POTASSIUM: 4.1 mmol/L (ref 3.5–5.1)
Sodium: 137 mmol/L (ref 135–145)
Total Protein: 6.6 g/dL (ref 6.5–8.1)

## 2015-01-06 LAB — CBC
HEMATOCRIT: 35.4 % — AB (ref 39.0–52.0)
Hemoglobin: 12.3 g/dL — ABNORMAL LOW (ref 13.0–17.0)
MCH: 28.9 pg (ref 26.0–34.0)
MCHC: 34.7 g/dL (ref 30.0–36.0)
MCV: 83.1 fL (ref 78.0–100.0)
PLATELETS: 191 10*3/uL (ref 150–400)
RBC: 4.26 MIL/uL (ref 4.22–5.81)
RDW: 14.8 % (ref 11.5–15.5)
WBC: 15.9 10*3/uL — ABNORMAL HIGH (ref 4.0–10.5)

## 2015-01-06 LAB — GLUCOSE, CAPILLARY
GLUCOSE-CAPILLARY: 209 mg/dL — AB (ref 65–99)
GLUCOSE-CAPILLARY: 247 mg/dL — AB (ref 65–99)
Glucose-Capillary: 238 mg/dL — ABNORMAL HIGH (ref 65–99)
Glucose-Capillary: 255 mg/dL — ABNORMAL HIGH (ref 65–99)

## 2015-01-06 LAB — URINE CULTURE: Culture: NO GROWTH

## 2015-01-06 LAB — TROPONIN I: Troponin I: <0.03 ng/mL (ref <0.031)

## 2015-01-06 LAB — HIV ANTIBODY (ROUTINE TESTING W REFLEX): HIV Screen 4th Generation wRfx: NONREACTIVE

## 2015-01-06 MED ORDER — POLYETHYLENE GLYCOL 3350 17 G PO PACK
17.0000 g | PACK | Freq: Every day | ORAL | Status: DC
  Administered 2015-01-06 – 2015-01-07 (×2): 17 g via ORAL
  Filled 2015-01-06 (×2): qty 1

## 2015-01-06 MED ORDER — INSULIN GLARGINE 100 UNIT/ML ~~LOC~~ SOLN
15.0000 [IU] | Freq: Every day | SUBCUTANEOUS | Status: DC
  Administered 2015-01-06 – 2015-01-07 (×2): 15 [IU] via SUBCUTANEOUS
  Filled 2015-01-06 (×2): qty 0.15

## 2015-01-06 NOTE — Progress Notes (Signed)
[facility] PROGRESS NOTE  Oscar Sampson FXT:[id number] DOB: 06-05-1971 DOA: 01/05/2015 PCP: Tommy Medal, MD  Assessment/Plan:  1-PNA:  Patient presents with fever, dry cough, pleuritic chest pain.  CT abdomen confirm Right lower lobe PNA.  Continue with Ceftriaxone, Azithromycin day 2.  Follow Blood culture/  WBC trending down. RR decreased.   2-AKI: last cr per records 1.2 2014.  Continue with IV fluids.  Improved with IV fluids.   3-Leukocytosis:  In setting of PNA.  Continue with IV antibiotics.   4-Diabetes type 2 uncontrolled;  Hold metformin.  SSI. Hb A1 c pending.  Start Lantus.   5-Lung nodule:  Needs repeat CT chest in 1 year.   6-Transaminases: fatty liver.  Follow up outpatient.   7-Screening for HIV negative  8-Elevated troponin: EKG with T wave inversion, present last EKG.  Troponin times 3 negative 3.   ECHO pending.   Code Status: Full Code.  Family Communication: Care discussed with wife Disposition Plan: remain inpatient for IV antibiotics.    Consultants:  none  Procedures:  ECHO; pending.   Antibiotics:  Ceftriaxone 7-24  Azithromycin 7-24  HPI/Subjective: He is feeling better, breathing better.  Still with right side pleuritic chest pain./   Objective: Filed Vitals:   01/06/15 0731  BP: 121/77  Pulse: 82  Temp: 98.1 F (36.7 C)  Resp: 19    Intake/Output Summary (Last 24 hours) at 01/06/15 1731 Last data filed at 01/06/15 1600  Gross per 24 hour  Intake 4080.42 ml  Output   2975 ml  Net 1105.42 ml   Filed Weights   01/05/15 1057 01/05/15 2027 01/06/15 0500  Weight: 108.863 kg (240 lb) 107.7 kg (237 lb 7 oz) 107.7 kg (237 lb 7 oz)    Exam:   General:  NAD  Cardiovascular: S 1, S 2 RRR  Respiratory: Few ronchus,   Abdomen: BS present, NT  Musculoskeletal: no edema  Data Reviewed: Basic Metabolic Panel:  Recent Labs Lab 01/05/15 1200 01/06/15 0541  NA 136 137  K 4.1 4.1  CL 101  104  CO2 26 26  GLUCOSE 284* 241*  BUN 8 8  CREATININE 1.44* 1.27*  CALCIUM 9.4 8.8*   Liver Function Tests:  Recent Labs Lab 01/05/15 1200 01/06/15 0541  AST 64* 65*  ALT 107* 108*  ALKPHOS 73 69  BILITOT 1.0 0.9  PROT 7.4 6.6  ALBUMIN 4.0 3.2*    Recent Labs Lab 01/05/15 1200  LIPASE 28   No results for input(s): AMMONIA in the last 168 hours. CBC:  Recent Labs Lab 01/05/15 1110 01/05/15 1200 01/06/15 0541  WBC 17.9* 17.2* 15.9*  NEUTROABS 15.5* 14.8*  --   HGB 13.8 13.6 12.3*  HCT 39.5 38.8* 35.4*  MCV 82.1 82.2 83.1  PLT 242 224 191   Cardiac Enzymes:  Recent Labs Lab 01/05/15 1200 01/05/15 1639 01/05/15 2240 01/06/15 0541  TROPONINI 0.09* <0.03 <0.03 <0.03   BNP (last 3 results) No results for input(s): BNP in the last 8760 hours.  ProBNP (last 3 results) No results for input(s): PROBNP in the last 8760 hours.  CBG:  Recent Labs Lab 01/05/15 1712 01/05/15 2127 01/06/15 0742 01/06/15 1205 01/06/15 1638  GLUCAP 234* 247* 209* 238* 247*    Recent Results (from the past 240 hour(s))  Blood Culture (routine x 2)     Status: None (Preliminary result)   Collection Time: 01/05/15 12:00 PM  Result Value Ref Range Status   Specimen Description BLOOD RIGHT ANTECUBITAL  Final  Special Requests BOTTLES DRAWN AEROBIC AND ANAEROBIC 5CC  Final   Culture NO GROWTH < 24 HOURS  Final   Report Status PENDING  Incomplete  Urine culture     Status: None   Collection Time: 01/05/15 12:15 PM  Result Value Ref Range Status   Specimen Description URINE, CLEAN CATCH  Final   Special Requests NONE  Final   Culture NO GROWTH 1 DAY  Final   Report Status 01/06/2015 FINAL  Final  Blood Culture (routine x 2)     Status: None (Preliminary result)   Collection Time: 01/05/15 12:25 PM  Result Value Ref Range Status   Specimen Description LEFT ANTECUBITAL  Final   Special Requests BOTTLES DRAWN AEROBIC AND ANAEROBIC 10ML  Final   Culture NO GROWTH < 24 HOURS   Final   Report Status PENDING  Incomplete     Studies: Dg Chest 2 View  01/05/2015   CLINICAL DATA:  Right lower chest pain, cough and congestion  EXAM: CHEST  2 VIEW  COMPARISON:  12/29/2014  FINDINGS: Right basilar airspace opacity overlies the right hemidiaphragm and is posterior on the lateral view compatible with right basilar pneumonia. Left lung remains clear. No effusion or pneumothorax. Normal heart size and vascularity. Trachea midline.  IMPRESSION: Posterior right basilar pneumonia.   Electronically Signed   By: Jerilynn Mages.  Shick M.D.   On: 01/05/2015 13:29   Ct Abdomen Pelvis W Contrast  01/05/2015   CLINICAL DATA:  Right lower chest, right flank, and right lower quadrant abdominal pain for 1 week. Fever yesterday.  EXAM: CT ABDOMEN AND PELVIS WITH CONTRAST  TECHNIQUE: Multidetector CT imaging of the abdomen and pelvis was performed using the standard protocol following bolus administration of intravenous contrast.  CONTRAST:  165mL OMNIPAQUE IOHEXOL 300 MG/ML  SOLN  COMPARISON:  Chest radiographs earlier today  FINDINGS: Right lower lobe airspace consolidation is present as seen on recent chest radiographs. There are 3 mm nodules in the right lower lobe (series 3, image 2), right middle lobe (image 1), and a left lower lobe (image 11). There is no pleural effusion.  The markedly decreased attenuation of the liver is consistent with prominent steatosis. Focal fatty sparing is noted along the gallbladder fossa. The liver is enlarged, measuring 23-24 cm in length. The gallbladder, spleen, adrenal glands, left kidney, and pancreas have an unremarkable enhanced appearance. There are 2 small nonobstructing right renal calculi, the larger measuring 3 mm in the lower pole.  Oral contrast is present in nondilated loops of small bowel without evidence of obstruction. The appendix is identified in the right lower quadrant and is unremarkable.  Bladder is unremarkable. No free fluid or enlarged lymph nodes are  identified. No acute osseous abnormality is identified.  IMPRESSION: 1. Right lower lobe pneumonia. 2. No evidence of acute abnormality in the abdomen or pelvis. 3. Enlarged, fatty liver. 4. Nonobstructing right nephrolithiasis. 5. 3 mm basilar lung nodules. If the patient is at high risk for bronchogenic carcinoma, follow-up chest CT at 1 year is recommended. If the patient is at low risk, no follow-up is needed. This recommendation follows the consensus statement: Guidelines for Management of Small Pulmonary Nodules Detected on CT Scans: A Statement from the [place] as published in Radiology 2005; 237:395-400.   Electronically Signed   By: Logan Bores   On: 01/05/2015 14:56    Scheduled Meds: . azithromycin  500 mg Intravenous Q24H  . cefTRIAXone (ROCEPHIN)  IV  1 g Intravenous Q24H  .  enoxaparin (LOVENOX) injection  40 mg Subcutaneous Q24H  . guaiFENesin  600 mg Oral BID  . insulin aspart  0-9 Units Subcutaneous TID WC  . polyethylene glycol  17 g Oral Daily  . sodium chloride  3 mL Intravenous Q12H   Continuous Infusions: . sodium chloride 75 mL/hr at 01/06/15 1029    Principal Problem:   CAP (community acquired pneumonia) Active Problems:   Leukocytosis   Diabetes type 2, uncontrolled   Elevated troponin I level   PNA (pneumonia)    Time spent: 25 minutes.     Niel Hummer A  [facility] Pager [phone]. If 7PM-7AM, please contact night-coverage at [email or web address], password [facility] 01/06/2015, 5:31 PM  LOS: 1 day

## 2015-01-07 ENCOUNTER — Inpatient Hospital Stay (HOSPITAL_COMMUNITY): Payer: Self-pay

## 2015-01-07 DIAGNOSIS — R0600 Dyspnea, unspecified: Secondary | ICD-10-CM

## 2015-01-07 LAB — BASIC METABOLIC PANEL
Anion gap: 7 (ref 5–15)
BUN: 10 mg/dL (ref 6–20)
CO2: 25 mmol/L (ref 22–32)
Calcium: 9 mg/dL (ref 8.9–10.3)
Chloride: 103 mmol/L (ref 101–111)
Creatinine, Ser: 1.16 mg/dL (ref 0.61–1.24)
GFR calc Af Amer: >60 mL/min (ref >60)
GFR calc non Af Amer: >60 mL/min (ref >60)
Glucose, Bld: 212 mg/dL — ABNORMAL HIGH (ref 65–99)
Potassium: 3.9 mmol/L (ref 3.5–5.1)
Sodium: 135 mmol/L (ref 135–145)

## 2015-01-07 LAB — HEMOGLOBIN A1C
Hgb A1c MFr Bld: 9.4 % — ABNORMAL HIGH (ref 4.8–5.6)
Mean Plasma Glucose: 223 mg/dL

## 2015-01-07 LAB — CBC
HCT: 37.8 % — ABNORMAL LOW (ref 39.0–52.0)
Hemoglobin: 13 g/dL (ref 13.0–17.0)
MCH: 28.5 pg (ref 26.0–34.0)
MCHC: 34.4 g/dL (ref 30.0–36.0)
MCV: 82.9 fL (ref 78.0–100.0)
Platelets: 252 10*3/uL (ref 150–400)
RBC: 4.56 MIL/uL (ref 4.22–5.81)
RDW: 14.4 % (ref 11.5–15.5)
WBC: 8.8 10*3/uL (ref 4.0–10.5)

## 2015-01-07 LAB — GLUCOSE, CAPILLARY
Glucose-Capillary: 185 mg/dL — ABNORMAL HIGH (ref 65–99)
Glucose-Capillary: 200 mg/dL — ABNORMAL HIGH (ref 65–99)
Glucose-Capillary: 266 mg/dL — ABNORMAL HIGH (ref 65–99)

## 2015-01-07 MED ORDER — INSULIN GLARGINE 100 UNIT/ML ~~LOC~~ SOLN: 18.0000 [IU] | Freq: Every day | SUBCUTANEOUS | Status: DC

## 2015-01-07 MED ORDER — INSULIN PEN NEEDLE 31G X 8 MM MISC: 60.0000 | Freq: Two times a day (BID) | Status: DC

## 2015-01-07 MED ORDER — INSULIN STARTER KIT- PEN NEEDLES (ENGLISH)
1.0000 | Freq: Once | Status: AC
  Administered 2015-01-07: 1
  Filled 2015-01-07: qty 1

## 2015-01-07 MED ORDER — CEPHALEXIN 500 MG PO CAPS: 500.0000 mg | ORAL_CAPSULE | Freq: Four times a day (QID) | ORAL | Status: DC

## 2015-01-07 MED ORDER — LIVING WELL WITH DIABETES BOOK
Freq: Once | Status: AC
  Administered 2015-01-07: 18:00:00
  Filled 2015-01-07: qty 1

## 2015-01-07 MED ORDER — INSULIN GLARGINE 100 UNITS/ML SOLOSTAR PEN: 18.0000 [IU] | PEN_INJECTOR | Freq: Every day | SUBCUTANEOUS | Status: DC

## 2015-01-07 MED ORDER — AZITHROMYCIN 500 MG PO TABS: 500.0000 mg | ORAL_TABLET | Freq: Every day | ORAL | Status: DC

## 2015-01-07 MED ORDER — ALBUTEROL SULFATE HFA 108 (90 BASE) MCG/ACT IN AERS: 2.0000 | INHALATION_SPRAY | Freq: Four times a day (QID) | RESPIRATORY_TRACT | Status: DC | PRN

## 2015-01-07 MED ORDER — GUAIFENESIN ER 600 MG PO TB12: 600.0000 mg | ORAL_TABLET | Freq: Two times a day (BID) | ORAL | Status: DC

## 2015-01-07 MED ORDER — HYDROCODONE-ACETAMINOPHEN 5-325 MG PO TABS: 1.0000 | ORAL_TABLET | ORAL | Status: DC | PRN

## 2015-01-07 MED ORDER — POLYETHYLENE GLYCOL 3350 17 G PO PACK: 17.0000 g | PACK | Freq: Every day | ORAL | Status: DC

## 2015-01-07 NOTE — Progress Notes (Signed)
  Echocardiogram 2D Echocardiogram has been performed.  Oscar Sampson 01/07/2015, 2:29 PM

## 2015-01-07 NOTE — Progress Notes (Signed)
Patient discharged home per MD. Reviewed discharge instructions with patient and spouse. Patient demonstrated administtering novolog insulin to abdomen, while spouse demonstrated administration of lantus. Video on administration of insulin viewed by spouse. PIV discontinued with catheter intact. Pt chose to ambulate upon discharge escorted by spouse. Bartholomew Crews, RN

## 2015-01-07 NOTE — Discharge Summary (Signed)
Physician Discharge Summary  Oscar Sampson ASN:[id number] DOB: 1970-12-30 DOA: 01/05/2015  PCP: Tommy Medal, MD  Admit date: 01/05/2015 Discharge date: 01/07/2015  Time spent: 35 minutes  Recommendations for Outpatient Follow-up:  1. Follow up on resolution of PNA 2. Needs CT chest in 1 year to follow lung nodule.  3. Needs further adjustment of diabetes medication.  4. Repeat renal function prior resuming metformin.   Discharge Diagnoses:    CAP (community acquired pneumonia)   Leukocytosis   Diabetes type 2, uncontrolled   Elevated troponin I level   PNA (pneumonia)   Discharge Condition: Stable.   Diet recommendation: Heart Healthy  Filed Weights   01/06/15 0500 01/06/15 2020 01/07/15 0500  Weight: 107.7 kg (237 lb 7 oz) 109.238 kg (240 lb 13.2 oz) 109.23 kg (240 lb 12.9 oz)    History of present illness:  Oscar Sampson is a 44 y.o. male with PMH significant for Diabetes, rotator cuff surgery, who presents complaining of persistent dyspnea, cough, chest pain, right side that started 1 week ago. He was seeing in the ED on th 17, and discharge home. He presents with persistent fever, dry cough. Chest pain is pleuritic, worse with deep breath and after he cough. He denies diarrhea, vomiting. Does relates nausea.   Evaluation in the ED; D dimer negative, WBC at 17. CT abdomen: showed right Lower lobe PNA. Cr at 1.44. troponin mildly elevated at [facility] Course:   1-PNA:  Patient presents with fever, dry cough, pleuritic chest pain.  CT abdomen confirm Right lower lobe PNA.  Received Ceftriaxone, Azithromycin for  2 days.  Blood culture/ no growth to date.  He will be discharge on 5 days of Keflex and Azithromycin  2-AKI: last cr per records 1.2 2014.   Improved with IV fluids.   3-Leukocytosis:  In setting of PNA.  Trending down.   4-Diabetes type 2 uncontrolled;  Hold metformin.  SSI. Hb A1 c 9.  Started on  Lantus. Adjust medications further  outpatient.   5-Lung nodule:  Needs repeat CT chest in 1 year. Patient aware.   6-Transaminases: fatty liver.  Follow up outpatient.   7-Screening for HIV negative  8-Elevated troponin: EKG with T wave inversion, present last EKG.  Troponin times 3 negative 3.  ECHO normal wall motion Ef ^65 %  Procedures: ECHO; Ef 65 %, Left ventricle: The cavity size was normal. Wall thickness was increased in a pattern of mild LVH. Systolic function was vigorous. The estimated ejection fraction was in the range of 65% to 70%. Wall motion was normal; there were no regional wall motion abnormalities. Left ventricular diastolic function parameters were normal.   Consultations:  none  Discharge Exam: Filed Vitals:   01/07/15 0949  BP: 124/79  Pulse: 95  Temp: 98.1 F (36.7 C)  Resp: 18    General:NAD Cardiovascular: S 1, S 2 RRR Respiratory: CTA  Discharge Instructions   Discharge Instructions    Diet Carb Modified    Complete by:  As directed           Current Discharge Medication List    START taking these medications   Details  albuterol (PROVENTIL HFA;VENTOLIN HFA) 108 (90 BASE) MCG/ACT inhaler Inhale 2 puffs into the lungs every 6 (six) hours as needed for wheezing or shortness of breath. Qty: 1 Inhaler, Refills: 2    azithromycin (ZITHROMAX) 500 MG tablet Take 1 tablet (500 mg total) by mouth daily. Qty: 5 tablet, Refills: 0    cephALEXin (  KEFLEX) 500 MG capsule Take 1 capsule (500 mg total) by mouth 4 (four) times daily. Qty: 20 capsule, Refills: 0    guaiFENesin (MUCINEX) 600 MG 12 hr tablet Take 1 tablet (600 mg total) by mouth 2 (two) times daily. Qty: 30 tablet, Refills: 0    HYDROcodone-acetaminophen (NORCO/VICODIN) 5-325 MG per tablet Take 1-2 tablets by mouth every 4 (four) hours as needed for moderate pain. Qty: 10 tablet, Refills: 0    insulin glargine (LANTUS) 100 UNIT/ML injection Inject 0.18 mLs (18 Units total) into the skin at  bedtime. Qty: 10 mL, Refills: 11    polyethylene glycol (MIRALAX / GLYCOLAX) packet Take 17 g by mouth daily. Qty: 14 each, Refills: 0      STOP taking these medications     sitaGLIPtan-metformin (JANUMET) 50-1000 MG per tablet      naphazoline-pheniramine (NAPHCON-A) 0.025-0.3 % ophthalmic solution      oxyCODONE-acetaminophen (ROXICET) 5-325 MG per tablet        Allergies  Allergen Reactions  . Neosporin [Neomycin-Bacitracin Zn-Polymyx] Hives and Swelling   Follow-up Information    Follow up with PANG,RICHARD, MD In 1 week.   Specialty:  Internal Medicine   Contact information:   [address], [place]  [address] [phone]        The results of significant diagnostics from this hospitalization (including imaging, microbiology, ancillary and laboratory) are listed below for reference.    Significant Diagnostic Studies: Dg Chest 2 View  01/05/2015   CLINICAL DATA:  Right lower chest pain, cough and congestion  EXAM: CHEST  2 VIEW  COMPARISON:  12/29/2014  FINDINGS: Right basilar airspace opacity overlies the right hemidiaphragm and is posterior on the lateral view compatible with right basilar pneumonia. Left lung remains clear. No effusion or pneumothorax. Normal heart size and vascularity. Trachea midline.  IMPRESSION: Posterior right basilar pneumonia.   Electronically Signed   By: Jerilynn Mages.  Shick M.D.   On: 01/05/2015 13:29   Dg Chest 2 View  12/29/2014   CLINICAL DATA:  Pt states right side CP and SOB x 1 week. Pt denies any injury. Non-smoker.  EXAM: CHEST  2 VIEW  COMPARISON:  11/29/2011  FINDINGS: The heart size and mediastinal contours are within normal limits. Both lungs are clear. No pleural effusion or pneumothorax. The visualized skeletal structures are unremarkable.  IMPRESSION: No active cardiopulmonary disease.   Electronically Signed   By: Lajean Manes M.D.   On: 12/29/2014 08:46   Ct Abdomen Pelvis W Contrast  01/05/2015   CLINICAL DATA:   Right lower chest, right flank, and right lower quadrant abdominal pain for 1 week. Fever yesterday.  EXAM: CT ABDOMEN AND PELVIS WITH CONTRAST  TECHNIQUE: Multidetector CT imaging of the abdomen and pelvis was performed using the standard protocol following bolus administration of intravenous contrast.  CONTRAST:  18mL OMNIPAQUE IOHEXOL 300 MG/ML  SOLN  COMPARISON:  Chest radiographs earlier today  FINDINGS: Right lower lobe airspace consolidation is present as seen on recent chest radiographs. There are 3 mm nodules in the right lower lobe (series 3, image 2), right middle lobe (image 1), and a left lower lobe (image 11). There is no pleural effusion.  The markedly decreased attenuation of the liver is consistent with prominent steatosis. Focal fatty sparing is noted along the gallbladder fossa. The liver is enlarged, measuring 23-24 cm in length. The gallbladder, spleen, adrenal glands, left kidney, and pancreas have an unremarkable enhanced appearance. There are 2 small nonobstructing right renal  calculi, the larger measuring 3 mm in the lower pole.  Oral contrast is present in nondilated loops of small bowel without evidence of obstruction. The appendix is identified in the right lower quadrant and is unremarkable.  Bladder is unremarkable. No free fluid or enlarged lymph nodes are identified. No acute osseous abnormality is identified.  IMPRESSION: 1. Right lower lobe pneumonia. 2. No evidence of acute abnormality in the abdomen or pelvis. 3. Enlarged, fatty liver. 4. Nonobstructing right nephrolithiasis. 5. 3 mm basilar lung nodules. If the patient is at high risk for bronchogenic carcinoma, follow-up chest CT at 1 year is recommended. If the patient is at low risk, no follow-up is needed. This recommendation follows the consensus statement: Guidelines for Management of Small Pulmonary Nodules Detected on CT Scans: A Statement from the [employer] as published in Radiology 2005; 237:395-400.    Electronically Signed   By: Logan Bores   On: 01/05/2015 14:56    Microbiology: Recent Results (from the past 240 hour(s))  Blood Culture (routine x 2)     Status: None (Preliminary result)   Collection Time: 01/05/15 12:00 PM  Result Value Ref Range Status   Specimen Description BLOOD RIGHT ANTECUBITAL  Final   Special Requests BOTTLES DRAWN AEROBIC AND ANAEROBIC 5CC  Final   Culture NO GROWTH 2 DAYS  Final   Report Status PENDING  Incomplete  Urine culture     Status: None   Collection Time: 01/05/15 12:15 PM  Result Value Ref Range Status   Specimen Description URINE, CLEAN CATCH  Final   Special Requests NONE  Final   Culture NO GROWTH 1 DAY  Final   Report Status 01/06/2015 FINAL  Final  Blood Culture (routine x 2)     Status: None (Preliminary result)   Collection Time: 01/05/15 12:25 PM  Result Value Ref Range Status   Specimen Description LEFT ANTECUBITAL  Final   Special Requests BOTTLES DRAWN AEROBIC AND ANAEROBIC 10ML  Final   Culture NO GROWTH 2 DAYS  Final   Report Status PENDING  Incomplete     Labs: Basic Metabolic Panel:  Recent Labs Lab 01/05/15 1200 01/06/15 0541 01/07/15 0527  NA 136 137 135  K 4.1 4.1 3.9  CL 101 104 103  CO2 26 26 25   GLUCOSE 284* 241* 212*  BUN 8 8 10   CREATININE 1.44* 1.27* 1.16  CALCIUM 9.4 8.8* 9.0   Liver Function Tests:  Recent Labs Lab 01/05/15 1200 01/06/15 0541  AST 64* 65*  ALT 107* 108*  ALKPHOS 73 69  BILITOT 1.0 0.9  PROT 7.4 6.6  ALBUMIN 4.0 3.2*    Recent Labs Lab 01/05/15 1200  LIPASE 28   No results for input(s): AMMONIA in the last 168 hours. CBC:  Recent Labs Lab 01/05/15 1110 01/05/15 1200 01/06/15 0541 01/07/15 0527  WBC 17.9* 17.2* 15.9* 8.8  NEUTROABS 15.5* 14.8*  --   --   HGB 13.8 13.6 12.3* 13.0  HCT 39.5 38.8* 35.4* 37.8*  MCV 82.1 82.2 83.1 82.9  PLT 242 224 191 252   Cardiac Enzymes:  Recent Labs Lab 01/05/15 1200 01/05/15 1639 01/05/15 2240 01/06/15 0541   TROPONINI 0.09* <0.03 <0.03 <0.03   BNP: BNP (last 3 results) No results for input(s): BNP in the last 8760 hours.  ProBNP (last 3 results) No results for input(s): PROBNP in the last 8760 hours.  CBG:  Recent Labs Lab 01/06/15 1205 01/06/15 1638 01/06/15 2148 01/07/15 0752 01/07/15 1223  GLUCAP 238*  247* 255* 185* 200*       Signed:  Benjamyn Hestand A  [facility] 01/07/2015, 3:29 PM

## 2015-01-07 NOTE — Progress Notes (Signed)
Inpatient Diabetes Program Recommendations  AACE/ADA: New Consensus Statement on Inpatient Glycemic Control (2013)  Target Ranges:  Prepandial:   less than 140 mg/dL      Peak postprandial:   less than 180 mg/dL (1-2 hours)      Critically ill patients:  140 - 180 mg/dL   Consult received this afternoon for insulin pen teaching. Have ordered pt education using the ed video network on dm, #'s [phone]. Ordered the ed'l booklet, insulin pen starter kit, and to practice checking cbg's with floor meter. Pt should have OP education to follow. Can order if pt desires.  Thank you Rosita Kea, RN, MSN, CDE  Diabetes Inpatient Program Office: [phone] Pager: [phone] 8:00 am to 5:00 pm

## 2015-01-07 NOTE — Plan of Care (Signed)
Problem: Phase II Progression Outcomes Goal: Discharge plan established Outcome: Completed/Met Date Met:  01/07/15 Home with spouse. Diabetes coordinator consulted to assist with teaching forusing insulin pen.

## 2015-01-07 NOTE — Care Management Note (Signed)
Case Management Note  Patient Details  Name: Oscar Sampson MRN: [id number] Date of Birth: 10-27-1970  Subjective/Objective:      CM following for progression and d/c planning.              Action/Plan: 01/07/2015 Met with pt re medication needs. This pt states that he has [employer] ,and a copy of his card was made at the time of admission. The pt therefore has medication benefits  Expected Discharge Date:                  Expected Discharge Plan:  Home/Self Care  In-House Referral:  NA  Discharge planning Services  NA  Post Acute Care Choice:  NA Choice offered to:  NA  DME Arranged:    DME Agency:     HH Arranged:    HH Agency:     Status of Service:  Completed, signed off  Medicare Important Message Given:    Date Medicare IM Given:    Medicare IM give by:    Date Additional Medicare IM Given:    Additional Medicare Important Message give by:     If discussed at [place] of Stay Meetings, dates discussed:    Additional Comments:  Adron Bene, RN 01/07/2015, 1:18 PM

## 2015-01-07 NOTE — Progress Notes (Signed)
Inpatient Diabetes Program Recommendations  AACE/ADA: New Consensus Statement on Inpatient Glycemic Control (2013)  Target Ranges:  Prepandial:   less than 140 mg/dL      Peak postprandial:   less than 180 mg/dL (1-2 hours)      Critically ill patients:  140 - 180 mg/dL   Reason for Visit: Insulin Pen Instruction   Results for Oscar Sampson, Oscar Sampson (MRN [id number]) as of 01/07/2015 17:58  Ref. Range 01/06/2015 16:38 01/06/2015 21:48 01/07/2015 07:52 01/07/2015 12:23 01/07/2015 17:20  Glucose-Capillary Latest Ref Range: 65-99 mg/dL 247 (H) 255 (H) 185 (H) 200 (H) 266 (H)     Results for BRAN, ALDRIDGE (MRN [id number]) as of 01/07/2015 17:58  Ref. Range 01/05/2015 17:25  Hemoglobin A1C Latest Ref Range: 4.8-5.6 % 9.4 (H)   Pt with DM2 on Janumet 50/1000 mg bid at home x 1 year. To be discharged on Lantus 18 units QHS.  Educated patient and spouse on insulin pen use at home. Reviewed contents of insulin flexpen starter kit. Reviewed all steps if insulin pen including attachment of needle, 2-unit air shot, dialing up dose, giving injection, removing needle, disposal of sharps, storage of unused insulin, disposal of insulin etc. Patient able to provide successful return demonstration. Also reviewed troubleshooting with insulin pen. MD to give patient Rxs for insulin pens and insulin pen needles. Stressed importance of f/u with PCP for diabetes management. Discussed above with RN.  Thank you. Lorenda Peck, RD, LDN, CDE Inpatient Diabetes Coordinator [phone]

## 2015-01-10 LAB — CULTURE, BLOOD (ROUTINE X 2)
Culture: NO GROWTH
Culture: NO GROWTH

## 2016-04-11 ENCOUNTER — Encounter (HOSPITAL_COMMUNITY): Payer: Self-pay | Admitting: Emergency Medicine

## 2016-04-11 ENCOUNTER — Emergency Department (HOSPITAL_COMMUNITY)
Admission: EM | Admit: 2016-04-11 | Discharge: 2016-04-11 | Disposition: A | Discharge status: Home / Self Care | Payer: Worker's Compensation | Attending: Emergency Medicine | Admitting: Emergency Medicine

## 2016-04-11 DIAGNOSIS — E119 Type 2 diabetes mellitus without complications: Secondary | ICD-10-CM | POA: Insufficient documentation

## 2016-04-11 DIAGNOSIS — H578 Other specified disorders of eye and adnexa: Secondary | ICD-10-CM | POA: Insufficient documentation

## 2016-04-11 DIAGNOSIS — H5789 Other specified disorders of eye and adnexa: Secondary | ICD-10-CM

## 2016-04-11 DIAGNOSIS — Z794 Long term (current) use of insulin: Secondary | ICD-10-CM | POA: Diagnosis not present

## 2016-04-11 MED ORDER — TETRACAINE HCL 0.5 % OP SOLN
1.0000 [drp] | Freq: Once | OPHTHALMIC | Status: AC
  Administered 2016-04-11: 2 [drp] via OPHTHALMIC
  Filled 2016-04-11: qty 2

## 2016-04-11 MED ORDER — FLUORESCEIN SODIUM 1 MG OP STRP
1.0000 | ORAL_STRIP | Freq: Once | OPHTHALMIC | Status: AC
  Administered 2016-04-11: 1 via OPHTHALMIC
  Filled 2016-04-11: qty 1
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
## 2016-04-11 NOTE — Discharge Instructions (Signed)
Please read attached information. If you experience any new or worsening signs or symptoms please return to the emergency room for evaluation. Please follow-up with your primary care provider or specialist as discussed.  °
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
## 2016-06-15 HISTORY — PX: SHOULDER SURGERY: SHX246

## 2016-07-04 ENCOUNTER — Emergency Department (HOSPITAL_COMMUNITY)
Admission: EM | Admit: 2016-07-04 | Discharge: 2016-07-04 | Disposition: A | Discharge status: Home / Self Care | Payer: Worker's Compensation | Attending: Emergency Medicine | Admitting: Emergency Medicine

## 2016-07-04 ENCOUNTER — Encounter (HOSPITAL_COMMUNITY): Payer: Self-pay | Admitting: Nurse Practitioner

## 2016-07-04 DIAGNOSIS — M5442 Lumbago with sciatica, left side: Secondary | ICD-10-CM | POA: Diagnosis not present

## 2016-07-04 DIAGNOSIS — Z794 Long term (current) use of insulin: Secondary | ICD-10-CM | POA: Diagnosis not present

## 2016-07-04 DIAGNOSIS — S3992XA Unspecified injury of lower back, initial encounter: Secondary | ICD-10-CM | POA: Diagnosis present

## 2016-07-04 DIAGNOSIS — Y939 Activity, unspecified: Secondary | ICD-10-CM | POA: Insufficient documentation

## 2016-07-04 DIAGNOSIS — Y990 Civilian activity done for income or pay: Secondary | ICD-10-CM | POA: Diagnosis not present

## 2016-07-04 DIAGNOSIS — M545 Low back pain, unspecified: Secondary | ICD-10-CM

## 2016-07-04 DIAGNOSIS — E119 Type 2 diabetes mellitus without complications: Secondary | ICD-10-CM | POA: Insufficient documentation

## 2016-07-04 DIAGNOSIS — T148XXA Other injury of unspecified body region, initial encounter: Secondary | ICD-10-CM

## 2016-07-04 DIAGNOSIS — W1839XA Other fall on same level, initial encounter: Secondary | ICD-10-CM | POA: Insufficient documentation

## 2016-07-04 DIAGNOSIS — S39012A Strain of muscle, fascia and tendon of lower back, initial encounter: Secondary | ICD-10-CM | POA: Diagnosis not present

## 2016-07-04 DIAGNOSIS — Y929 Unspecified place or not applicable: Secondary | ICD-10-CM | POA: Diagnosis not present

## 2016-07-04 MED ORDER — METHOCARBAMOL 500 MG PO TABS: 500.0000 mg | ORAL_TABLET | Freq: Two times a day (BID) | ORAL | 0 refills | Status: DC

## 2016-07-04 MED ORDER — IBUPROFEN 600 MG PO TABS: 600.0000 mg | ORAL_TABLET | Freq: Four times a day (QID) | ORAL | 0 refills | Status: DC | PRN

## 2016-07-04 MED ORDER — IBUPROFEN 200 MG PO TABS
600.0000 mg | ORAL_TABLET | Freq: Once | ORAL | Status: AC
  Administered 2016-07-04: 600 mg via ORAL
  Filled 2016-07-04: qty 3

## 2016-07-04 MED ORDER — METHOCARBAMOL 500 MG PO TABS
1000.0000 mg | ORAL_TABLET | Freq: Once | ORAL | Status: AC
  Administered 2016-07-04: 1000 mg via ORAL
  Filled 2016-07-04: qty 2

## 2016-07-04 NOTE — ED Provider Notes (Signed)
[facility] DEPT Provider Note   CSN: [id number] Arrival date & time: 07/04/16  0206     History   Chief Complaint Chief Complaint  Patient presents with  . Fall  . Back Pain    HPI Oscar Sampson is a 46 y.o. male.  HPI 46 y.o. male with a hx of DM, presents to the Emergency Department today complaining of low back pain s/p mechanical fall from work. Notes pain on left lower back due to twisting during fall. Notes mild head trauma without LOC. No N/V. No vision changes. No CP/SOB/ABD pain. No numbness/tingling. States pain is 8/10 and aching sensation. Worse with movement. Ambulates well. No midline spinous tenderness No loss of bowel or bladder function. No saddle anesthesia. No other symptoms noted. .   Past Medical History:  Diagnosis Date  . At risk for sleep apnea    STOP-BANG= 4    SENT TO PCP  12-01-2013  . Diabetes mellitus 2012    Patient Active Problem List   Diagnosis Date Noted  . CAP (community acquired pneumonia) 01/05/2015  . Leukocytosis 01/05/2015  . Diabetes type 2, uncontrolled ([place]) 01/05/2015  . Elevated troponin I level 01/05/2015  . PNA (pneumonia) 01/05/2015  . S/P arthroscopy of shoulder 12/05/2013    Past Surgical History:  Procedure Laterality Date  . SHOULDER ARTHROSCOPY WITH SUBACROMIAL DECOMPRESSION, ROTATOR CUFF REPAIR AND BICEP TENDON REPAIR Right 12/05/2013   Procedure: RGHT SHOULDER ARTHROSCOPY WITH SUBACROMIAL DECOMPRESSION,DISTAL CLAVICLE RESECTION, LABRAL DEBRIDEMENT ;  Surgeon: Sydnee Cabal, MD;  Location: [facility];  Service: Orthopedics;  Laterality: Right;       Home Medications    Prior to Admission medications   Medication Sig Start Date End Date Taking? Authorizing Provider  albuterol (PROVENTIL HFA;VENTOLIN HFA) 108 (90 BASE) MCG/ACT inhaler Inhale 2 puffs into the lungs every 6 (six) hours as needed for wheezing or shortness of breath. 01/07/15   Belkys A Regalado, MD  azithromycin (ZITHROMAX) 500  MG tablet Take 1 tablet (500 mg total) by mouth daily. 01/07/15   Belkys A Regalado, MD  cephALEXin (KEFLEX) 500 MG capsule Take 1 capsule (500 mg total) by mouth 4 (four) times daily. 01/07/15   Belkys A Regalado, MD  guaiFENesin (MUCINEX) 600 MG 12 hr tablet Take 1 tablet (600 mg total) by mouth 2 (two) times daily. 01/07/15   Belkys A Regalado, MD  HYDROcodone-acetaminophen (NORCO/VICODIN) 5-325 MG per tablet Take 1-2 tablets by mouth every 4 (four) hours as needed for moderate pain. 01/07/15   Belkys A Regalado, MD  insulin glargine (LANTUS) 100 UNIT/ML injection Inject 0.18 mLs (18 Units total) into the skin at bedtime. 01/07/15   Belkys A Regalado, MD  insulin glargine (LANTUS) 100 unit/mL SOPN Inject 0.18 mLs (18 Units total) into the skin at bedtime. 01/07/15   Charlynne Cousins, MD  Insulin Pen Needle (B-D ULTRAFINE III SHORT PEN) 31G X 8 MM MISC 60 Devices by Does not apply route 2 (two) times daily. 01/07/15   Charlynne Cousins, MD  polyethylene glycol [facility] / Floria Raveling) packet Take 17 g by mouth daily. 01/07/15   Elmarie Shiley, MD    Family History No family history on file.  Social History Social History  Substance Use Topics  . Smoking status: Never Smoker  . Smokeless tobacco: Never Used  . Alcohol use Yes     Allergies   Neosporin [neomycin-bacitracin zn-polymyx]   Review of Systems Review of Systems  Constitutional: Negative for fever.  Cardiovascular: Negative  for chest pain.  Gastrointestinal: Negative for abdominal pain, nausea and vomiting.  Musculoskeletal: Positive for back pain. Negative for gait problem.  Neurological: Negative for syncope and headaches.   Physical Exam Updated Vital Signs BP 145/88 (BP Location: Left Arm)   Pulse 98   Temp 97.6 F (36.4 C) (Oral)   Resp 16   SpO2 97%   Physical Exam  Constitutional: He is oriented to person, place, and time. Vital signs are normal. He appears well-developed and well-nourished.  HENT:  Head:  Normocephalic and atraumatic.  Right Ear: Hearing normal.  Left Ear: Hearing normal.  Eyes: Conjunctivae and EOM are normal. Pupils are equal, round, and reactive to light.  Neck: Normal range of motion. Neck supple.  Cardiovascular: Normal rate, regular rhythm, normal heart sounds and intact distal pulses.   Pulmonary/Chest: Effort normal and breath sounds normal.  Musculoskeletal: Normal range of motion.  TTP Left lower lumbar musculature. No midline spinous process tenderness. No palpable or visible deformities on exam  Neurological: He is alert and oriented to person, place, and time.  Skin: Skin is warm and dry.  Psychiatric: He has a normal mood and affect. His speech is normal and behavior is normal. Thought content normal.  Nursing note and vitals reviewed.  ED Treatments / Results  Labs (all labs ordered are listed, but only abnormal results are displayed) Labs Reviewed - No data to display  EKG  EKG Interpretation None      Radiology No results found.  Procedures Procedures (including critical care time)  Medications Ordered in ED Medications - No data to display   Initial Impression / Assessment and Plan / ED Course  I have reviewed the triage vital signs and the nursing notes.  Pertinent labs & imaging results that were available during my care of the patient were reviewed by me and considered in my medical decision making (see chart for details).    Final Clinical Impressions(s) / ED Diagnoses     {I have reviewed the relevant previous healthcare records.  {I obtained HPI from historian.   ED Course:  Assessment: Patient is a 45yM who presents to the ED with back pain s/p mechanical fall. No neurological deficits appreciated. Patient is ambulatory. No warning symptoms of back pain including: fecal incontinence, urinary retention or overflow incontinence, night sweats, waking from sleep with back pain, unexplained fevers or weight loss, h/o cancer, IVDU,  recent trauma. No concern for cauda equina, epidural abscess, or other serious cause of back pain. Conservative measures such as rest, ice/heat and pain medicine indicated with PCP follow-up if no improvement with conservative management.   Disposition/Plan:  DC Home Additional Verbal discharge instructions given and discussed with patient.  Pt Instructed to f/u with PCP in the next week for evaluation and treatment of symptoms. Return precautions given Pt acknowledges and agrees with plan  Supervising Physician Delora Fuel, MD  Final diagnoses:  Acute left-sided low back pain without sciatica  Muscle strain    New Prescriptions New Prescriptions   No medications on file     Shary Decamp, PA-C [id number]    David Glick, MD [id number] [id number]

## 2016-07-04 NOTE — ED Notes (Signed)
Urine drug screen performed. 

## 2016-07-04 NOTE — ED Triage Notes (Signed)
Pt c/o lower back pain secondary to a fall at work. Also requesting a "non DOT drug test for workers comp.

## 2016-07-04 NOTE — Discharge Instructions (Signed)
Please read and follow all provided instructions.  Your diagnoses today include:  1. Acute left-sided low back pain without sciatica   2. Muscle strain     Tests performed today include: Vital signs - see below for your results today  Medications prescribed:   Take any prescribed medications only as directed.  Home care instructions:  Follow any educational materials contained in this packet Please rest, use ice or heat on your back for the next several days Do not lift, push, pull anything more than 10 pounds for the next week  Follow-up instructions: Please follow-up with your primary care provider in the next 1 week for further evaluation of your symptoms.   Return instructions:  SEEK IMMEDIATE MEDICAL ATTENTION IF YOU HAVE: New numbness, tingling, weakness, or problem with the use of your arms or legs Severe back pain not relieved with medications Loss control of your bowels or bladder Increasing pain in any areas of the body (such as chest or abdominal pain) Shortness of breath, dizziness, or fainting.  Worsening nausea (feeling sick to your stomach), vomiting, fever, or sweats Any other emergent concerns regarding your health   Additional Information:  Your vital signs today were: BP 145/88 (BP Location: Left Arm)    Pulse 98    Temp 97.6 F (36.4 C) (Oral)    Resp 16    SpO2 97%  If your blood pressure (BP) was elevated above 135/85 this visit, please have this repeated by your doctor within one month. --------------

## 2016-08-06 ENCOUNTER — Emergency Department (HOSPITAL_COMMUNITY): Payer: 59

## 2016-08-06 ENCOUNTER — Emergency Department (HOSPITAL_COMMUNITY)
Admission: EM | Admit: 2016-08-06 | Discharge: 2016-08-06 | Disposition: A | Discharge status: Home / Self Care | Payer: 59 | Attending: Emergency Medicine | Admitting: Emergency Medicine

## 2016-08-06 DIAGNOSIS — Z79899 Other long term (current) drug therapy: Secondary | ICD-10-CM | POA: Diagnosis not present

## 2016-08-06 DIAGNOSIS — R05 Cough: Secondary | ICD-10-CM | POA: Diagnosis present

## 2016-08-06 DIAGNOSIS — J111 Influenza due to unidentified influenza virus with other respiratory manifestations: Secondary | ICD-10-CM | POA: Diagnosis not present

## 2016-08-06 DIAGNOSIS — E119 Type 2 diabetes mellitus without complications: Secondary | ICD-10-CM | POA: Insufficient documentation

## 2016-08-06 DIAGNOSIS — Z794 Long term (current) use of insulin: Secondary | ICD-10-CM | POA: Insufficient documentation

## 2016-08-06 DIAGNOSIS — R69 Illness, unspecified: Secondary | ICD-10-CM

## 2016-08-06 LAB — CBC WITH DIFFERENTIAL/PLATELET
Basophils Absolute: 0 10*3/uL (ref 0.0–0.1)
Basophils Relative: 0 %
Eosinophils Absolute: 0.1 10*3/uL (ref 0.0–0.7)
Eosinophils Relative: 1 %
HEMATOCRIT: 41.2 % (ref 39.0–52.0)
HEMOGLOBIN: 14.3 g/dL (ref 13.0–17.0)
LYMPHS ABS: 0.8 10*3/uL (ref 0.7–4.0)
LYMPHS PCT: 12 %
MCH: 29 pg (ref 26.0–34.0)
MCHC: 34.7 g/dL (ref 30.0–36.0)
MCV: 83.6 fL (ref 78.0–100.0)
MONOS PCT: 9 %
Monocytes Absolute: 0.6 10*3/uL (ref 0.1–1.0)
NEUTROS ABS: 5.5 10*3/uL (ref 1.7–7.7)
NEUTROS PCT: 78 %
Platelets: 267 10*3/uL (ref 150–400)
RBC: 4.93 MIL/uL (ref 4.22–5.81)
RDW: 13.4 % (ref 11.5–15.5)
WBC: 7.1 10*3/uL (ref 4.0–10.5)

## 2016-08-06 LAB — BASIC METABOLIC PANEL
ANION GAP: 8 (ref 5–15)
BUN: 7 mg/dL (ref 6–20)
CO2: 26 mmol/L (ref 22–32)
Calcium: 9.6 mg/dL (ref 8.9–10.3)
Chloride: 102 mmol/L (ref 101–111)
Creatinine, Ser: 1.32 mg/dL — ABNORMAL HIGH (ref 0.61–1.24)
GFR calc Af Amer: >60 mL/min (ref >60)
GLUCOSE: 211 mg/dL — AB (ref 65–99)
POTASSIUM: 4.1 mmol/L (ref 3.5–5.1)
Sodium: 136 mmol/L (ref 135–145)

## 2016-08-06 LAB — I-STAT TROPONIN, ED: Troponin i, poc: 0 ng/mL (ref 0.00–0.08)

## 2016-08-06 MED ORDER — OSELTAMIVIR PHOSPHATE 75 MG PO CAPS: 75.0000 mg | ORAL_CAPSULE | Freq: Two times a day (BID) | ORAL | 0 refills | Status: DC

## 2016-08-06 MED ORDER — KETOROLAC TROMETHAMINE 30 MG/ML IJ SOLN
30.0000 mg | Freq: Once | INTRAMUSCULAR | Status: AC
  Administered 2016-08-06: 30 mg via INTRAVENOUS
  Filled 2016-08-06: qty 1

## 2016-08-06 MED ORDER — SODIUM CHLORIDE 0.9 % IV BOLUS (SEPSIS)
1000.0000 mL | Freq: Once | INTRAVENOUS | Status: AC
  Administered 2016-08-06: 1000 mL via INTRAVENOUS

## 2016-08-06 MED ORDER — ACETAMINOPHEN 325 MG PO TABS
650.0000 mg | ORAL_TABLET | Freq: Once | ORAL | Status: AC
  Administered 2016-08-06: 650 mg via ORAL
  Filled 2016-08-06: qty 2

## 2016-08-06 MED ORDER — ALBUTEROL SULFATE HFA 108 (90 BASE) MCG/ACT IN AERS: 2.0000 | INHALATION_SPRAY | RESPIRATORY_TRACT | 0 refills | Status: DC | PRN

## 2016-08-06 MED ORDER — AZITHROMYCIN 250 MG PO TABS: 250.0000 mg | ORAL_TABLET | Freq: Every day | ORAL | 0 refills | Status: DC

## 2016-08-06 NOTE — ED Triage Notes (Signed)
Sorethroat, cough , cold and chest pain when he coughs

## 2016-08-06 NOTE — ED Notes (Signed)
Declined W/C at D/C and was escorted to lobby by RN. 

## 2016-08-06 NOTE — Discharge Instructions (Signed)
There were no acute abnormalities noted on the chest xray. Your symptoms are consistent with a viral illness, such as influenza, however it is possible to develop a bacterial infection as well. Due to your previous history with pneumonia following the flu, we are going to start you on an antibiotic now.  Otherwise, treatment for viruses is symptomatic care and it is important to note that these symptoms may last for 7-14 days. Symptoms will be intensified and complicated by dehydration. Dehydration can also extend the duration of symptoms. Drink plenty of fluids and get plenty of rest. You should be drinking at least half a liter of water an hour to stay hydrated. Electrolyte drinks are also encouraged. You should be drinking enough fluids to make your urine light yellow, almost clear. If this is not the case, you are not drinking enough water. Ibuprofen, Naproxen, or Tylenol for pain or fever. Plain Mucinex may help relieve congestion. Saline sinus rinses and saline nasal sprays may also help relieve congestion. Warm liquids or Chloraseptic spray may help soothe a sore throat. Follow up with a primary care provider, as needed, for any future management of this issue.  Please return to the ED immediately for onset of shortness of breath, sustained chest pain, uncontrolled fever, worsening cough, or any other concerning symptoms.

## 2016-08-06 NOTE — ED Provider Notes (Signed)
[facility] DEPT Provider Note   CSN: [id number] Arrival date & time: 08/06/16  [id number]   By signing my name below, I, Neta Mends, attest that this documentation has been prepared under the direction and in the presence of Trafton Roker, PA-C. Electronically Signed: Neta Mends, ED Scribe. 08/06/2016. 9:38 AM.   History   Chief Complaint Chief Complaint  Patient presents with  . URI    The history is provided by the patient. No language interpreter was used.  HPI Comments:  Oscar L Imad Kris. is a 46 y.o. male who presents to the Emergency Department complaining of a persistent, productive cough since yesterday. Pt complains of associated scratchy throat, chest pain with coughing, congestion, and fever of 101 today. He states that his cough has produced yellow sputum, and states that when he coughs he has pain that radiates across his chest. Pain feels like a soreness and resolves immediately after coughing subsides. Pt has been urinating normally. Pt denies hx of asthma or COPD, and is not a smoker. Pt has taken Theraflu with no relief. Pt denies N/V/D, SOB, abdominal pain, dizziness, diaphoresis, or any other complaints.   Past Medical History:  Diagnosis Date  . At risk for sleep apnea    STOP-BANG= 4    SENT TO PCP  12-01-2013  . Diabetes mellitus 2012    Patient Active Problem List   Diagnosis Date Noted  . CAP (community acquired pneumonia) 01/05/2015  . Leukocytosis 01/05/2015  . Diabetes type 2, uncontrolled ([place]) 01/05/2015  . Elevated troponin I level 01/05/2015  . PNA (pneumonia) 01/05/2015  . S/P arthroscopy of shoulder 12/05/2013    Past Surgical History:  Procedure Laterality Date  . SHOULDER ARTHROSCOPY WITH SUBACROMIAL DECOMPRESSION, ROTATOR CUFF REPAIR AND BICEP TENDON REPAIR Right 12/05/2013   Procedure: RGHT SHOULDER ARTHROSCOPY WITH SUBACROMIAL DECOMPRESSION,DISTAL CLAVICLE RESECTION, LABRAL DEBRIDEMENT ;  Surgeon: Sydnee Cabal, MD;   Location: [facility];  Service: Orthopedics;  Laterality: Right;       Home Medications    Prior to Admission medications   Medication Sig Start Date End Date Taking? Authorizing Provider  albuterol (PROVENTIL HFA;VENTOLIN HFA) 108 (90 BASE) MCG/ACT inhaler Inhale 2 puffs into the lungs every 6 (six) hours as needed for wheezing or shortness of breath. 01/07/15   Belkys A Regalado, MD  albuterol (PROVENTIL HFA;VENTOLIN HFA) 108 (90 Base) MCG/ACT inhaler Inhale 2 puffs into the lungs every 4 (four) hours as needed for wheezing or shortness of breath. 08/06/16   Audria Takeshita C Arial Galligan, PA-C  azithromycin (ZITHROMAX) 250 MG tablet Take 1 tablet (250 mg total) by mouth daily. Take first 2 tablets together, then 1 every day until finished. 08/06/16   Brodi Nery C Maie Kesinger, PA-C  guaiFENesin (MUCINEX) 600 MG 12 hr tablet Take 1 tablet (600 mg total) by mouth 2 (two) times daily. 01/07/15   Belkys A Regalado, MD  HYDROcodone-acetaminophen (NORCO/VICODIN) 5-325 MG per tablet Take 1-2 tablets by mouth every 4 (four) hours as needed for moderate pain. 01/07/15   Belkys A Regalado, MD  ibuprofen (ADVIL,MOTRIN) 600 MG tablet Take 1 tablet (600 mg total) by mouth every 6 (six) hours as needed. 07/04/16   Shary Decamp, PA-C  insulin glargine (LANTUS) 100 UNIT/ML injection Inject 0.18 mLs (18 Units total) into the skin at bedtime. 01/07/15   Belkys A Regalado, MD  insulin glargine (LANTUS) 100 unit/mL SOPN Inject 0.18 mLs (18 Units total) into the skin at bedtime. 01/07/15   Charlynne Cousins, MD  Insulin Pen Needle (B-D ULTRAFINE III SHORT PEN) 31G X 8 MM MISC 60 Devices by Does not apply route 2 (two) times daily. 01/07/15   Charlynne Cousins, MD  methocarbamol (ROBAXIN) 500 MG tablet Take 1 tablet (500 mg total) by mouth 2 (two) times daily. 07/04/16   Shary Decamp, PA-C  oseltamivir (TAMIFLU) 75 MG capsule Take 1 capsule (75 mg total) by mouth every 12 (twelve) hours. 08/06/16   Tonea Leiphart C Ellakate Gonsalves, PA-C  polyethylene glycol  (MIRALAX / GLYCOLAX) packet Take 17 g by mouth daily. 01/07/15   Elmarie Shiley, MD    Family History No family history on file.  Social History Social History  Substance Use Topics  . Smoking status: Never Smoker  . Smokeless tobacco: Never Used  . Alcohol use Yes     Allergies   Neosporin [neomycin-bacitracin zn-polymyx]   Review of Systems Review of Systems  Constitutional: Positive for fever.  HENT: Positive for sore throat.   Respiratory: Positive for cough. Negative for shortness of breath.   Cardiovascular: Positive for chest pain (when coughing).  Gastrointestinal: Negative for abdominal pain, diarrhea and vomiting.  Genitourinary: Negative for dysuria.  Neurological: Negative for dizziness, syncope, light-headedness and headaches.  All other systems reviewed and are negative.    Physical Exam Updated Vital Signs BP 143/95   Pulse (!) 122   Temp 101.3 F (38.5 C) (Oral)   Resp 16   SpO2 98%   Physical Exam  Constitutional: He appears well-developed and well-nourished. No distress.  HENT:  Head: Normocephalic and atraumatic.  Eyes: Conjunctivae are normal.  Neck: Neck supple.  Cardiovascular: Regular rhythm, normal heart sounds and intact distal pulses.  Tachycardia present.   Pulmonary/Chest: Effort normal and breath sounds normal. No respiratory distress. He exhibits no tenderness.  No increased work of breathing. Patient speaks in full sentences without difficulty.  Abdominal: Soft. There is no tenderness. There is no guarding.  Musculoskeletal: He exhibits no edema.  Lymphadenopathy:    He has no cervical adenopathy.  Neurological: He is alert.  Skin: Skin is warm and dry. He is not diaphoretic.  Psychiatric: He has a normal mood and affect. His behavior is normal.  Nursing note and vitals reviewed.    ED Treatments / Results  DIAGNOSTIC STUDIES:  Oxygen Saturation is 98% on RA, normal by my interpretation.    COORDINATION OF  CARE:  9:36 AM Pt instructed to keep drinking fluids. Discussed treatment plan with pt at bedside and pt agreed to plan.   Labs (all labs ordered are listed, but only abnormal results are displayed) Labs Reviewed  BASIC METABOLIC PANEL - Abnormal; Notable for the following:       Result Value   Glucose, Bld 211 (*)    Creatinine, Ser 1.32 (*)    All other components within normal limits  CBC WITH DIFFERENTIAL/PLATELET  I-STAT TROPOININ, ED   BUN  Date Value Ref Range Status  08/06/2016 7 6 - 20 mg/dL Final  01/07/2015 10 6 - 20 mg/dL Final  01/06/2015 8 6 - 20 mg/dL Final  01/05/2015 8 6 - 20 mg/dL Final   Creatinine, Ser  Date Value Ref Range Status  08/06/2016 1.32 (H) 0.61 - 1.24 mg/dL Final  01/07/2015 1.16 0.61 - 1.24 mg/dL Final  01/06/2015 1.27 (H) 0.61 - 1.24 mg/dL Final  01/05/2015 1.44 (H) 0.61 - 1.24 mg/dL Final     EKG  EKG Interpretation  Date/Time:  Thursday August 06 2016 10:37:10 EST Ventricular Rate:  109 PR Interval:  132 QRS Duration: 84 QT Interval:  320 QTC Calculation: 430 R Axis:   53 Text Interpretation:  Sinus tachycardia T wave abnormality, consider inferolateral ischemia Abnormal ECG No significant change since last tracing Confirmed by [facility] MD, PEDRO ([id number]) on 08/06/2016 11:03:38 AM       Radiology Dg Chest 2 View  Result Date: 08/06/2016 CLINICAL DATA:  Sore throat.  Cough . EXAM: CHEST  2 VIEW COMPARISON:  No recent prior . FINDINGS: Mediastinum and hilar structures normal. Lungs are clear. No pleural effusion or pneumothorax. No acute bony abnormality. IMPRESSION: No acute cardiopulmonary disease.  Chest is stable from prior exam . Electronically Signed   By: Marcello Moores  Register   On: 08/06/2016 09:56    Procedures Procedures (including critical care time)  Medications Ordered in ED Medications  acetaminophen (TYLENOL) tablet 650 mg (650 mg Oral Given 08/06/16 0918)  sodium chloride 0.9 % bolus 1,000 mL (0 mLs Intravenous  Stopped 08/06/16 1242)  ketorolac (TORADOL) 30 MG/ML injection 30 mg (30 mg Intravenous Given 08/06/16 1048)     Initial Impression / Assessment and Plan / ED Course  I have reviewed the triage vital signs and the nursing notes.  Pertinent labs & imaging results that were available during my care of the patient were reviewed by me and considered in my medical decision making (see chart for details).      Patient presents with cough, fever, and pain in the chest when coughing. I suspect influenza versus other viral respiratory illness. He is nontoxic appearing and in no apparent distress. He has chest discomfort with cough, but since he does not have tenderness to the chest wall, I wanted to investigate further. I suspect his tachycardia to be from dehydration and fever.   Upon initial reevaluation, he voices some improvement in how he feels overall following the Tylenol, but continues to be tachycardic. IV fluids started and labs drawn. My suspicion for PE is low in this patient due to preceding symptoms, productive cough, and fever.  Patient improved significantly with IV fluids. EKG consistent with previous. Troponin negative. Slight elevation in creatinine likely due to dehydration. He continues to show no increased work of breathing. Able to ambulate without difficulty, assistance, shortness breath, or chest pain.  Patient states that he had a similar presentation last summer with similar symptoms that ended up developing into pneumonia. This resulted in a hospital stay of a week. For this reason, I believe ABX therapy is indicated. Home care and strict return precautions discussed. Patient voices understanding of all instructions and is comfortable with discharge.    Findings and plan of care discussed with Addison Lank, MD.   Vitals:   08/06/16 [id number] 08/06/16 0930 08/06/16 1002 08/06/16 1004  BP: 143/95 152/90 148/83   Pulse: (!) 122 116  114  Resp: 16     Temp: 101.3 F (38.5 C)      TempSrc: Oral     SpO2: 98% 99%  97%   Vitals:   08/06/16 1100 08/06/16 1130 08/06/16 1230 08/06/16 1240  BP: 132/79 128/82 118/80   Pulse: 102 111 97 99  Resp:    20  Temp:    98.9 F (37.2 C)  TempSrc:    Oral  SpO2: 92% 97% 95% 100%     Final Clinical Impressions(s) / ED Diagnoses   Final diagnoses:  Influenza-like illness    New Prescriptions Discharge Medication List as of 08/06/2016  1:14 PM    START  taking these medications   Details  !! albuterol (PROVENTIL HFA;VENTOLIN HFA) 108 (90 Base) MCG/ACT inhaler Inhale 2 puffs into the lungs every 4 (four) hours as needed for wheezing or shortness of breath., Starting Thu 08/06/2016, Print    oseltamivir (TAMIFLU) 75 MG capsule Take 1 capsule (75 mg total) by mouth every 12 (twelve) hours., Starting Thu 08/06/2016, Print     !! - Potential duplicate medications found. Please discuss with provider.      I personally performed the services described in this documentation, which was scribed in my presence. The recorded information has been reviewed and is accurate.    Lorayne Bender, PA-C 08/06/16 [place], MD 08/06/16 [phone]

## 2018-09-09 IMAGING — DX DG CHEST 2V
2 series · 2 of 2 positions shown · non-contrast
Comparison: No recent prior .

CLINICAL DATA: Sore throat.  Cough .

EXAM:
CHEST  2 VIEW

[chest pa]
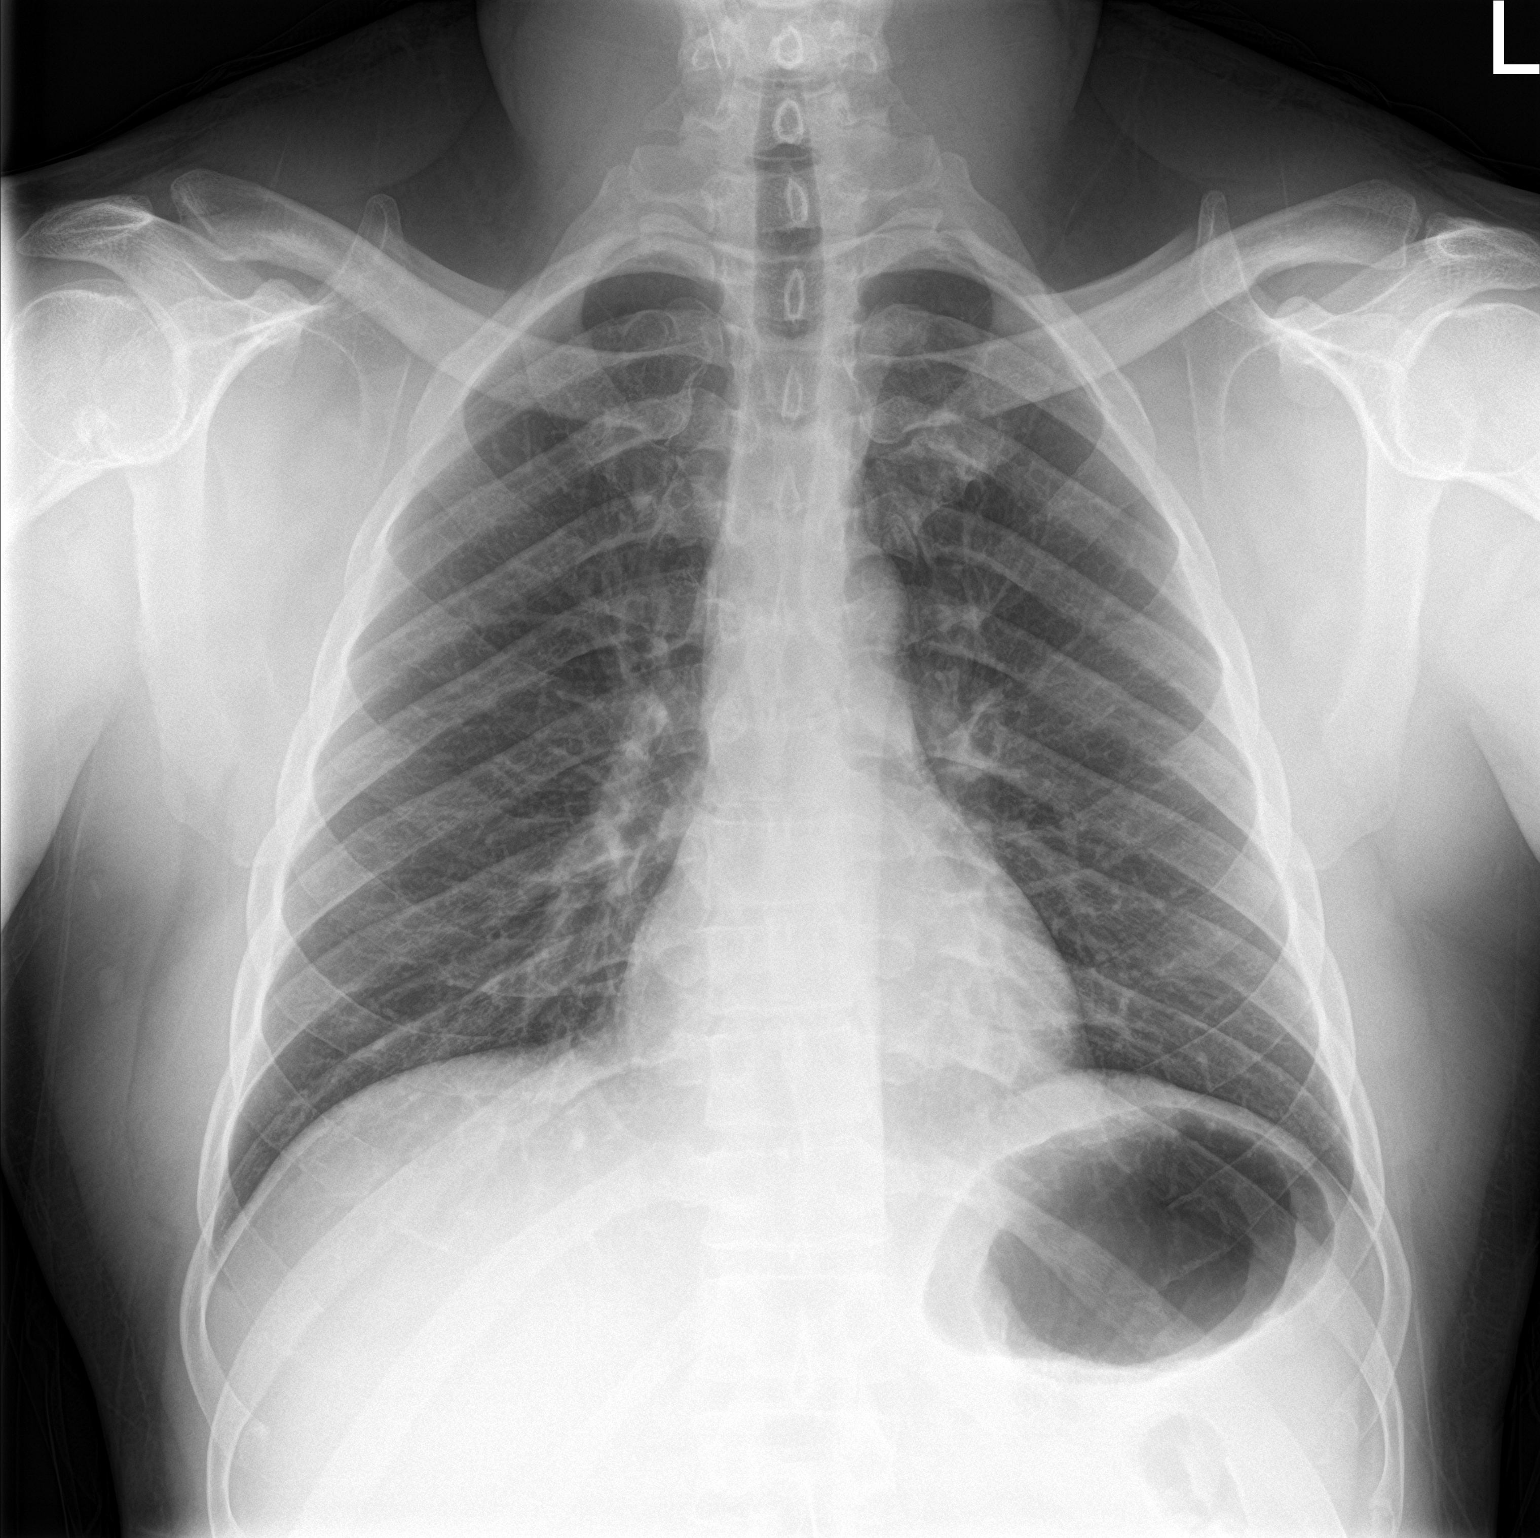

[chest lat]
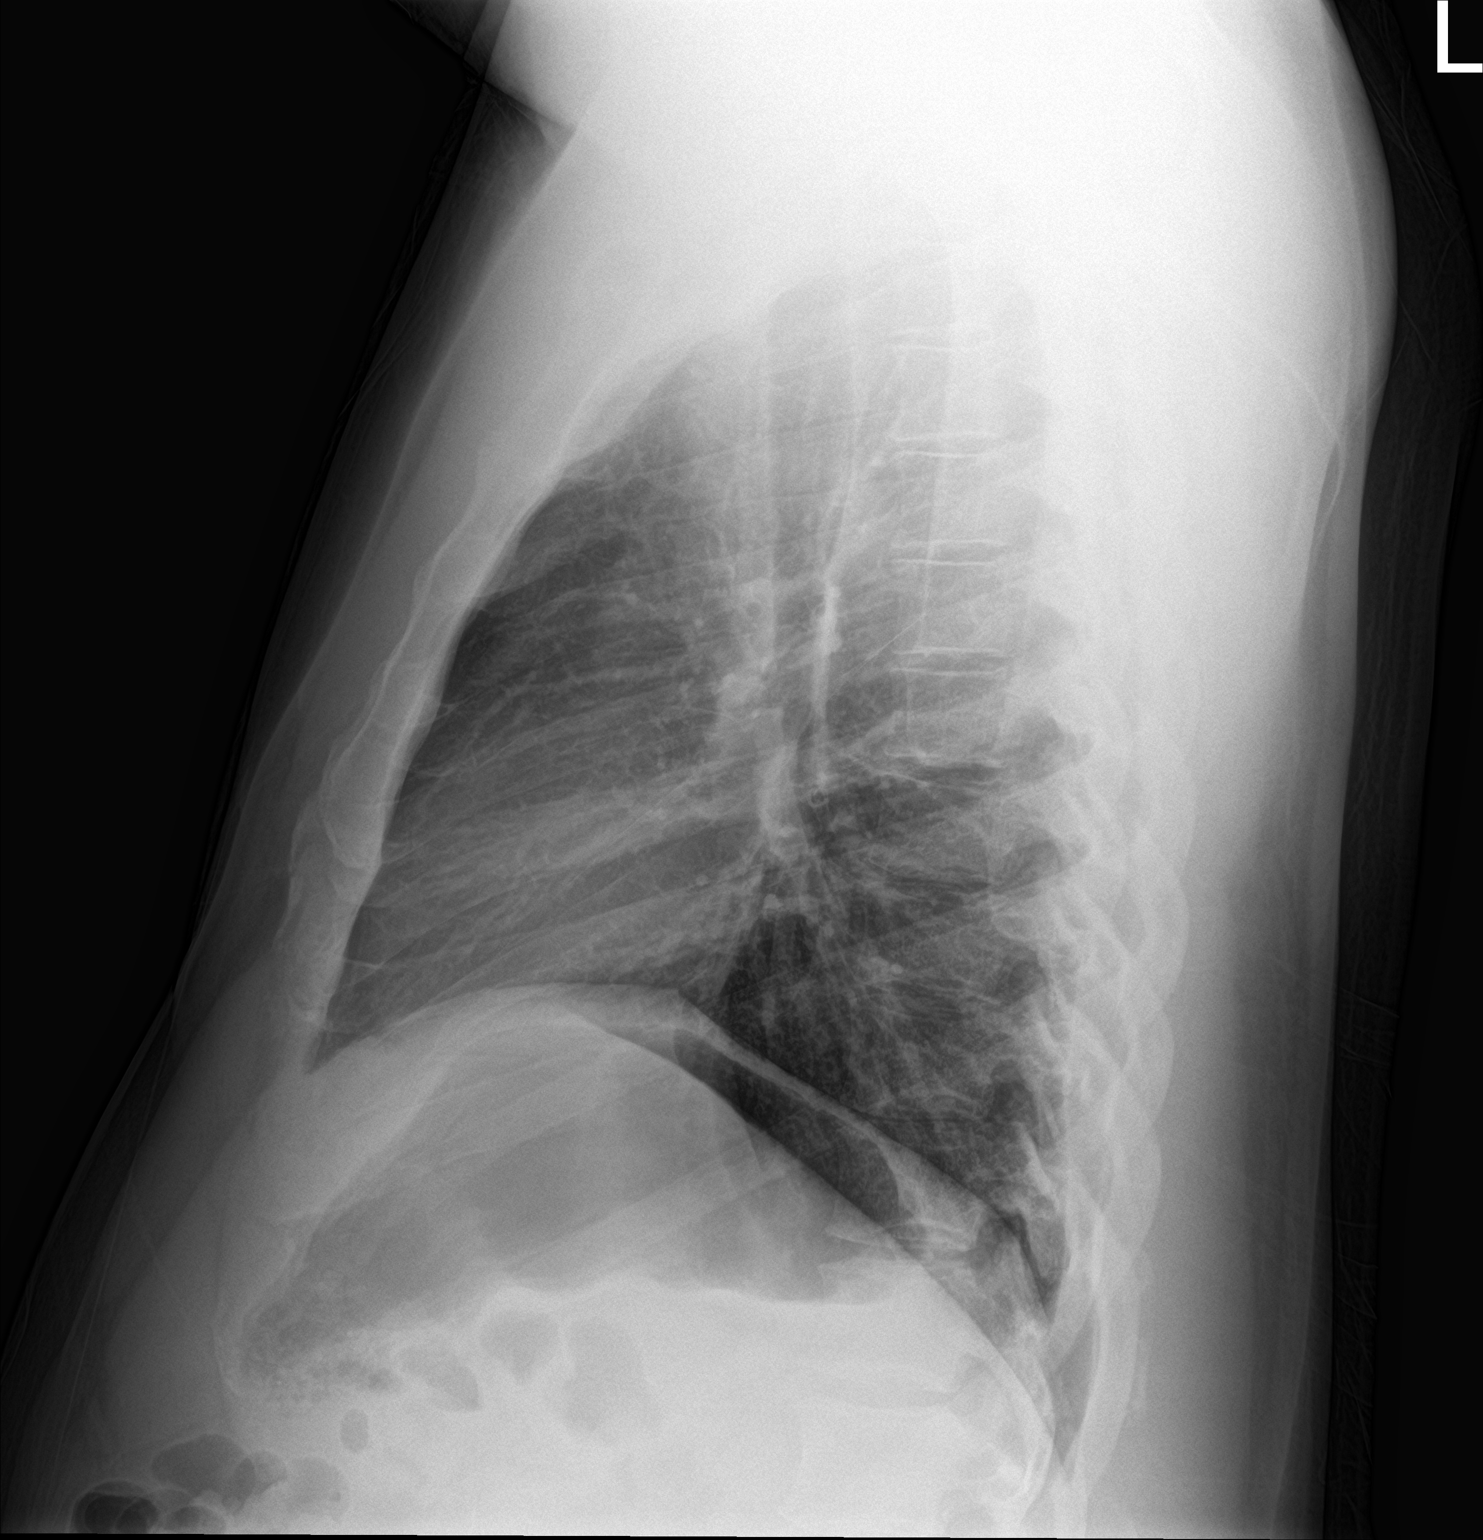

[2 of 2 positions shown; findings below may reference images not displayed]

FINDINGS: Mediastinum and hilar structures normal. Lungs are clear. No pleural
effusion or pneumothorax. No acute bony abnormality.
IMPRESSION: No acute cardiopulmonary disease.  Chest is stable from prior exam .

## 2018-12-09 ENCOUNTER — Other Ambulatory Visit: Payer: Self-pay

## 2018-12-09 ENCOUNTER — Emergency Department (HOSPITAL_COMMUNITY)
Admission: EM | Admit: 2018-12-09 | Discharge: 2018-12-10 | Disposition: A | Discharge status: Home / Self Care | Payer: 59 | Attending: Emergency Medicine | Admitting: Emergency Medicine

## 2018-12-09 ENCOUNTER — Encounter (HOSPITAL_COMMUNITY): Payer: Self-pay | Admitting: Emergency Medicine

## 2018-12-09 DIAGNOSIS — E119 Type 2 diabetes mellitus without complications: Secondary | ICD-10-CM | POA: Insufficient documentation

## 2018-12-09 DIAGNOSIS — Z794 Long term (current) use of insulin: Secondary | ICD-10-CM | POA: Insufficient documentation

## 2018-12-09 DIAGNOSIS — G510 Bell's palsy: Secondary | ICD-10-CM

## 2018-12-09 MED ORDER — PREDNISONE 20 MG PO TABS
60.0000 mg | ORAL_TABLET | Freq: Once | ORAL | Status: AC
  Administered 2018-12-10: 60 mg via ORAL
  Filled 2018-12-09: qty 3

## 2018-12-09 MED ORDER — ACYCLOVIR 200 MG PO CAPS
400.0000 mg | ORAL_CAPSULE | Freq: Once | ORAL | Status: AC
  Administered 2018-12-10: 400 mg via ORAL
  Filled 2018-12-09: qty 2

## 2018-12-09 MED ORDER — ACYCLOVIR 400 MG PO TABS: 400.0000 mg | ORAL_TABLET | Freq: Every day | ORAL | 0 refills | Status: DC

## 2018-12-09 MED ORDER — PREDNISONE 20 MG PO TABS: 60.0000 mg | ORAL_TABLET | Freq: Every day | ORAL | 0 refills | Status: DC

## 2018-12-09 NOTE — ED Provider Notes (Signed)
TIME SEEN: 11:06 PM  CHIEF COMPLAINT: Right-sided facial weakness  HPI: Patient is a 48 year old male with history of diabetes who presents to the emergency department with right-sided facial droop, tearing of the right eye that started on the night of the 24th.  He states that he felt like his face felt abnormal but denies numbness or tingling.  He is able to close his eye.  No vision changes.  No fevers, cough, vomiting or diarrhea.  No other numbness or focal weakness.  No history of stroke.  No head injury.  No headache.  Not on blood thinners.  ROS: See HPI Constitutional: no fever  Eyes: no drainage  ENT: no runny nose   Cardiovascular:  no chest pain  Resp: no SOB  GI: no vomiting GU: no dysuria Integumentary: no rash  Allergy: no hives  Musculoskeletal: no leg swelling  Neurological: no slurred speech ROS otherwise negative  PAST MEDICAL HISTORY/PAST SURGICAL HISTORY:  Past Medical History:  Diagnosis Date  . At risk for sleep apnea    STOP-BANG= 4    SENT TO PCP  12-01-2013  . Diabetes mellitus 2012    MEDICATIONS:  Prior to Admission medications   Medication Sig Start Date End Date Taking? Authorizing Provider  albuterol (PROVENTIL HFA;VENTOLIN HFA) 108 (90 BASE) MCG/ACT inhaler Inhale 2 puffs into the lungs every 6 (six) hours as needed for wheezing or shortness of breath. 01/07/15   Regalado, Belkys A, MD  albuterol (PROVENTIL HFA;VENTOLIN HFA) 108 (90 Base) MCG/ACT inhaler Inhale 2 puffs into the lungs every 4 (four) hours as needed for wheezing or shortness of breath. 08/06/16   Joy, Shawn C, PA-C  azithromycin (ZITHROMAX) 250 MG tablet Take 1 tablet (250 mg total) by mouth daily. Take first 2 tablets together, then 1 every day until finished. 08/06/16   Joy, Shawn C, PA-C  guaiFENesin (MUCINEX) 600 MG 12 hr tablet Take 1 tablet (600 mg total) by mouth 2 (two) times daily. 01/07/15   Regalado, Belkys A, MD  HYDROcodone-acetaminophen (NORCO/VICODIN) 5-325 MG per tablet  Take 1-2 tablets by mouth every 4 (four) hours as needed for moderate pain. 01/07/15   Regalado, Belkys A, MD  ibuprofen (ADVIL,MOTRIN) 600 MG tablet Take 1 tablet (600 mg total) by mouth every 6 (six) hours as needed. 07/04/16   Shary Decamp, PA-C  insulin glargine (LANTUS) 100 UNIT/ML injection Inject 0.18 mLs (18 Units total) into the skin at bedtime. 01/07/15   Regalado, Belkys A, MD  insulin glargine (LANTUS) 100 unit/mL SOPN Inject 0.18 mLs (18 Units total) into the skin at bedtime. 01/07/15   Charlynne Cousins, MD  Insulin Pen Needle (B-D ULTRAFINE III SHORT PEN) 31G X 8 MM MISC 60 Devices by Does not apply route 2 (two) times daily. 01/07/15   Charlynne Cousins, MD  methocarbamol (ROBAXIN) 500 MG tablet Take 1 tablet (500 mg total) by mouth 2 (two) times daily. 07/04/16   Shary Decamp, PA-C  oseltamivir (TAMIFLU) 75 MG capsule Take 1 capsule (75 mg total) by mouth every 12 (twelve) hours. 08/06/16   Joy, Shawn C, PA-C  polyethylene glycol (MIRALAX / GLYCOLAX) packet Take 17 g by mouth daily. 01/07/15   Regalado, Cassie Freer, MD    ALLERGIES:  Allergies  Allergen Reactions  . Neosporin [Neomycin-Bacitracin Zn-Polymyx] Hives and Swelling    SOCIAL HISTORY:  Social History   Tobacco Use  . Smoking status: Never Smoker  . Smokeless tobacco: Never Used  Substance Use Topics  . Alcohol use:  Yes    FAMILY HISTORY: No family history on file.  EXAM: BP (!) 130/94 (BP Location: Right Arm)   Pulse 89   Temp 98.2 F (36.8 C) (Oral)   Resp (!) 23   SpO2 98%  CONSTITUTIONAL: Alert and oriented and responds appropriately to questions. Well-appearing; well-nourished HEAD: Normocephalic EYES: Conjunctivae clear, pupils appear equal, EOMI ENT: normal nose; moist mucous membranes NECK: Supple, no meningismus, no nuchal rigidity, no LAD  CARD: RRR; S1 and S2 appreciated; no murmurs, no clicks, no rubs, no gallops RESP: Normal chest excursion without splinting or tachypnea; breath sounds clear  and equal bilaterally; no wheezes, no rhonchi, no rales, no hypoxia or respiratory distress, speaking full sentences ABD/GI: Normal bowel sounds; non-distended; soft, non-tender, no rebound, no guarding, no peritoneal signs, no hepatosplenomegaly BACK:  The back appears normal and is non-tender to palpation, there is no CVA tenderness EXT: Normal ROM in all joints; non-tender to palpation; no edema; normal capillary refill; no cyanosis, no calf tenderness or swelling    SKIN: Normal color for age and race; warm; no rash, specifically no vesicular lesions NEURO: Moves all extremities equally, normal sensation diffusely, strength 5/5 in all 4 extremities, normal speech, patient has a facial droop to the right side of his face that involves his forehead.  He is able to close the right eye fully but cannot squeeze it as tightly shut as the left side. PSYCH: The patient's mood and manner are appropriate. Grooming and personal hygiene are appropriate.  MEDICAL DECISION MAKING: Patient here with Bell's palsy.  Patient's forehead is involved.  He has no sensory deficits.  No other focal neurologic deficits.  Patient is right at 72 hours of symptoms.  Will start him on steroids as well as acyclovir.  He is able to close his eye at this time but have discussed with him that if he is not able to close this eye fully, I recommend an eye patch at night.  Discussed return precautions with patient.  He verbalized understanding and is comfortable with this plan.  Doubt stroke, meningitis, encephalitis, intracranial hemorrhage.  He does not need emergent head imaging today.  No signs of Ramsay Hunt syndrome.  At this time, I do not feel there is any life-threatening condition present. I have reviewed and discussed all results (EKG, imaging, lab, urine as appropriate) and exam findings with patient/family. I have reviewed nursing notes and appropriate previous records.  I feel the patient is safe to be discharged home  without further emergent workup and can continue workup as an outpatient as needed. Discussed usual and customary return precautions. Patient/family verbalize understanding and are comfortable with this plan.  Outpatient follow-up has been provided as needed. All questions have been answered.      Ward, Delice Bison, DO 12/10/18 0200

## 2018-12-09 NOTE — ED Triage Notes (Signed)
Patient reports R sided facial numbness since yesterday morning. Lower R side of face asymmetric compared to L side. Denies other symptoms.

## 2020-06-15 HISTORY — PX: COLONOSCOPY: SHX174

## 2020-09-24 ENCOUNTER — Encounter: Payer: Self-pay | Admitting: Gastroenterology

## 2020-11-27 ENCOUNTER — Other Ambulatory Visit: Payer: Self-pay

## 2020-11-27 ENCOUNTER — Ambulatory Visit (AMBULATORY_SURGERY_CENTER): Payer: 59 | Admitting: *Deleted

## 2020-11-27 VITALS — Ht 71.0 in | Wt 230.0 lb

## 2020-11-27 DIAGNOSIS — Z1211 Encounter for screening for malignant neoplasm of colon: Secondary | ICD-10-CM

## 2020-11-27 MED ORDER — PEG 3350-KCL-NA BICARB-NACL 420 G PO SOLR: 4000.0000 mL | Freq: Once | ORAL | 0 refills | Status: AC

## 2020-11-27 NOTE — Progress Notes (Signed)
Pt verified name, DOB, address and insurance during PV today. Pt mailed instruction packet to included paper to complete and mail back to [facility] with addressed and stamped envelope, Emmi video, copy of consent form to read and not return, and instructions. PV completed over the phone.  Pt encouraged to call with questions or issues after PV   No egg or soy allergy known to patient  No issues with past sedation with any surgeries or procedures Patient denies ever being told they had issues or difficulty with intubation  No FH of Malignant Hyperthermia No diet pills per patient No home 02 use per patient  No blood thinners per patient  Pt denies issues with constipation - uses Miralax PRN- [facility] issue due to meds - if he uses Miralax it helps , not chronic  No A fib or A flutter  EMMI video to pt or via [place] 19 guidelines implemented in PV today with Pt and RN  Pt is fully vaccinated  for Covid   Due to the COVID-19 pandemic we are asking patients to follow certain guidelines.  Pt aware of COVID protocols and [facility] guidelines

## 2020-12-04 NOTE — Progress Notes (Signed)
Office Visit Note  Patient: Oscar ECKENRODE Sr.             Date of Birth: 1970-11-16           MRN: [id number]             PCP: Jolinda Croak, MD Referring: Jolinda Croak, MD Visit Date: 12/18/2020 Occupation: _0 @  Subjective:  Pain in multiple joints.   History of Present Illness: Oscar L Saheed Carrington. is a 50 y.o. male seen in consultation for evaluation of joint pain.  According the patient Oscar Sampson had right shoulder joint arthroscopic surgery in 2015 for rotator cuff tear and left shoulder joint surgery in 2018 after a fall for rotator cuff tear.  Oscar Sampson states Oscar Sampson always had some joint aches and pains.  Although his discomfort has been worse in 2022.  Oscar Sampson gives history of intermittent swelling in his hands to the point Oscar Sampson has difficulty making a fist.  Oscar Sampson also has discomfort in his elbows, and his knees.  Oscar Sampson also gives history of some lower back discomfort.  Oscar Sampson states Oscar Sampson travels a lot and his back constantly hurts.  Oscar Sampson would like to have x-ray of his lower back.  There is no history of rash.  There is history of rheumatoid arthritis in his father.  Oscar Sampson has 2 healthy children.  Oscar Sampson has 8 siblings who are in good health except for diabetes.  Activities of Daily Living:  Patient reports morning stiffness for all day. Patient Reports nocturnal pain.  Difficulty dressing/grooming: Reports Difficulty climbing stairs: Reports Difficulty getting out of chair: Reports Difficulty using hands for taps, buttons, cutlery, and/or writing: Reports  Review of Systems  Constitutional:  Negative for fatigue.  HENT:  Negative for mouth sores, mouth dryness and nose dryness.   Eyes:  Negative for pain, itching and dryness.  Respiratory:  Negative for shortness of breath and difficulty breathing.   Cardiovascular:  Negative for chest pain and palpitations.  Gastrointestinal:  Negative for blood in stool, constipation and diarrhea.  Endocrine: Negative for increased urination.  Genitourinary:   Negative for difficulty urinating.  Musculoskeletal:  Positive for joint pain, joint pain, joint swelling, myalgias, morning stiffness, muscle tenderness and myalgias.  Skin:  Negative for color change, rash and redness.  Allergic/Immunologic: Negative for susceptible to infections.  Neurological:  Positive for numbness and parasthesias. Negative for dizziness, headaches, memory loss and weakness.  Hematological:  Negative for bruising/bleeding tendency.  Psychiatric/Behavioral:  Negative for confusion.    PMFS History:  Patient Active Problem List   Diagnosis Date Noted   CAP (community acquired pneumonia) 01/05/2015   Leukocytosis 01/05/2015   Diabetes type 2, uncontrolled ([place]) 01/05/2015   Elevated troponin I level 01/05/2015   PNA (pneumonia) 01/05/2015   S/P arthroscopy of shoulder 12/05/2013    Past Medical History:  Diagnosis Date   At risk for sleep apnea    STOP-BANG= 4    SENT TO PCP  12-01-2013   Diabetes mellitus 2012    Family History  Problem Relation Age of Onset   Diabetes Mother    Hypertension Father    Healthy Sister    Healthy Sister    Healthy Sister    Healthy Brother    Diabetes Brother    Publishing copy    Healthy Brother    Healthy Brother    Healthy Daughter    Healthy Son    Colon cancer Neg Hx  Colon polyps Neg Hx    Esophageal cancer Neg Hx    Stomach cancer Neg Hx    Rectal cancer Neg Hx    Past Surgical History:  Procedure Laterality Date   COLONOSCOPY  2022   SHOULDER ARTHROSCOPY WITH SUBACROMIAL DECOMPRESSION, ROTATOR CUFF REPAIR AND BICEP TENDON REPAIR Right 12/05/2013   Procedure: RGHT SHOULDER ARTHROSCOPY WITH SUBACROMIAL DECOMPRESSION,DISTAL CLAVICLE RESECTION, LABRAL DEBRIDEMENT ;  Surgeon: Sydnee Cabal, MD;  Location: [facility];  Service: Orthopedics;  Laterality: Right;   SHOULDER SURGERY Right 2016   2nd time 2016   SHOULDER SURGERY Left 2018   Social History   Social History  Narrative   Not on file    There is no immunization history on file for this patient.   Objective: Vital Signs: BP (!) 154/93 (BP Location: Left Arm, Patient Position: Sitting, Cuff Size: Normal)   Pulse 89   Resp 17   Ht _0  (1.803 m)   Wt 226 lb 3.2 oz (102.6 kg)   BMI 31.55 kg/m    Physical Exam Vitals and nursing note reviewed.  Constitutional:      Appearance: Oscar Sampson is well-developed.  HENT:     Head: Normocephalic and atraumatic.  Eyes:     Conjunctiva/sclera: Conjunctivae normal.     Pupils: Pupils are equal, round, and reactive to light.  Cardiovascular:     Rate and Rhythm: Normal rate and regular rhythm.     Heart sounds: Normal heart sounds.  Pulmonary:     Effort: Pulmonary effort is normal.     Breath sounds: Normal breath sounds.  Abdominal:     General: Bowel sounds are normal.     Palpations: Abdomen is soft.  Musculoskeletal:     Cervical back: Normal range of motion and neck supple.  Skin:    General: Skin is warm and dry.     Capillary Refill: Capillary refill takes less than 2 seconds.  Neurological:     Mental Status: Oscar Sampson is alert and oriented to person, place, and time.  Psychiatric:        Behavior: Behavior normal.     Musculoskeletal Exam: C-spine was in good range of motion.  Oscar Sampson had painful range of motion of his lumbar spine.  Oscar Sampson had no point tenderness.  Shoulder joints and elbow joints in good range of motion.  Oscar Sampson had tenderness over right lateral epicondyle region.  Oscar Sampson had no synovitis or tenderness over wrist joints.  Oscar Sampson had tenderness over right second and first MCP joints and PIP joints.  Oscar Sampson had tenderness over left hand PIP joints.  Hip joints were in good range of motion with some discomfort.  Oscar Sampson had good range of motion of bilateral knee joints with discomfort without any warmth swelling or effusion.  Oscar Sampson had no tenderness over ankles or MTPs.  Oscar Sampson had tenderness over plantar fascia.  CDAI Exam: CDAI Score: -- Patient Global: --;  Provider Global: -- Swollen: --; Tender: -- Joint Exam 12/18/2020   No joint exam has been documented for this visit   There is currently no information documented on the homunculus. Go to the Rheumatology activity and complete the homunculus joint exam.  Investigation: No additional findings.  Imaging: No results found.  Recent Labs: Lab Results  Component Value Date   WBC 7.1 08/06/2016   HGB 14.3 08/06/2016   PLT 267 08/06/2016   NA 136 08/06/2016   K 4.1 08/06/2016   CL 102 08/06/2016   CO2 26 08/06/2016  GLUCOSE 211 (H) 08/06/2016   BUN 7 08/06/2016   CREATININE 1.32 (H) 08/06/2016   BILITOT 0.9 01/06/2015   ALKPHOS 69 01/06/2015   AST 65 (H) 01/06/2015   ALT 108 (H) 01/06/2015   PROT 6.6 01/06/2015   ALBUMIN 3.2 (L) 01/06/2015   CALCIUM 9.6 08/06/2016   GFRAA >60 08/06/2016    Speciality Comments: No specialty comments available.  Procedures:  No procedures performed Allergies: Neosporin [neomycin-bacitracin zn-polymyx]   Assessment / Plan:     Visit Diagnoses: Polyarthralgia -complains of pain and discomfort in multiple joints over the years.  Oscar Sampson states the pain has been gradually getting worse.  I will obtain following labs today.  Plan: Sedimentation rate, Cyclic citrul peptide antibody, IgG, 14-3-3 eta Protein, Angiotensin converting enzyme  Pain in both hands -Oscar Sampson has difficulty making a complete fist.  Oscar Sampson had tenderness on palpation over right first and second MCP joints.  Oscar Sampson also had tenderness over all PIP joints.  Plan: XR Hand 2 View Right, XR Hand 2 View Left.  X-ray of the bilateral hands were consistent with osteoarthritis.  Chronic pain of both knees -Oscar Sampson complains of discomfort and swelling in his knee joints.  No warmth swelling or effusion was noted.  Plan: XR KNEE 3 VIEW RIGHT, XR KNEE 3 VIEW LEFT.  Right knee joint showed mild osteoarthritis.  Left knee joint x-ray was unremarkable.  Pain in both feet -Oscar Sampson complains of discomfort in his feet.   No synovitis was noted.  Oscar Sampson has some tenderness over plantar fascia.  Plan: XR Foot 2 Views Right, XR Foot 2 Views Left.  X-ray of bilateral feet were consistent with osteoarthritis.  Chronic midline low back pain without sciatica -Oscar Sampson complains of chronic lower back pain.  Plan: XR Lumbar Spine 2-3 Views  Other fatigue -Oscar Sampson gives history of fatigue.  Plan: CBC with Differential/Platelet, COMPLETE METABOLIC PANEL WITH GFR, CK, Glucose 6 phosphate dehydrogenase, Serum protein electrophoresis with reflex, IgG, IgA, IgM  Elevated sed rate - 10/11/20: ESR 41, CRP 20, ANA negative, RF<10, uric acid 6.7. 08/15/20: TSH 0.718, vitamin B12 824  Elevated C-reactive protein (CRP)  S/P arthroscopy of shoulder - bilateral  Uncontrolled type 2 diabetes mellitus with hyperglycemia (HCC)  Type 2 diabetes mellitus with diabetic polyneuropathy, without long-term current use of insulin (HCC)  Family history of rheumatoid arthritis - father  Orders: Orders Placed This Encounter  Procedures   XR Hand 2 View Right   XR Hand 2 View Left   XR KNEE 3 VIEW RIGHT   XR KNEE 3 VIEW LEFT   XR Foot 2 Views Right   XR Foot 2 Views Left   XR Lumbar Spine 2-3 Views   CBC with Differential/Platelet   COMPLETE METABOLIC PANEL WITH GFR   Sedimentation rate   CK   Cyclic citrul peptide antibody, IgG   14-3-3 eta Protein   Angiotensin converting enzyme   Glucose 6 phosphate dehydrogenase   Serum protein electrophoresis with reflex   IgG, IgA, IgM    No orders of the defined types were placed in this encounter.    Follow-Up Instructions: Return for Pain in multiple joints.   Bo Merino, MD  Note - This record has been created using Editor, commissioning.  Chart creation errors have been sought, but may not always  have been located. Such creation errors do not reflect on  the standard of medical care.

## 2020-12-11 ENCOUNTER — Ambulatory Visit (AMBULATORY_SURGERY_CENTER): Payer: 59 | Admitting: Gastroenterology

## 2020-12-11 ENCOUNTER — Other Ambulatory Visit: Payer: Self-pay

## 2020-12-11 ENCOUNTER — Encounter: Payer: Self-pay | Admitting: Gastroenterology

## 2020-12-11 VITALS — BP 137/82 | HR 97 | Temp 96.2°F | Resp 17 | Ht 71.0 in | Wt 230.0 lb

## 2020-12-11 DIAGNOSIS — D120 Benign neoplasm of cecum: Secondary | ICD-10-CM | POA: Diagnosis not present

## 2020-12-11 DIAGNOSIS — D125 Benign neoplasm of sigmoid colon: Secondary | ICD-10-CM | POA: Diagnosis not present

## 2020-12-11 DIAGNOSIS — Z1211 Encounter for screening for malignant neoplasm of colon: Secondary | ICD-10-CM

## 2020-12-11 MED ORDER — SODIUM CHLORIDE 0.9 % IV SOLN: 500.0000 mL | Freq: Once | INTRAVENOUS | Status: DC

## 2020-12-11 NOTE — Progress Notes (Signed)
Report to PACU, RN, vss, BBS= Clear.  

## 2020-12-11 NOTE — Progress Notes (Signed)
Pt's states no medical or surgical changes since previsit or office visit. 

## 2020-12-11 NOTE — Progress Notes (Signed)
N.C vital signs. 

## 2020-12-11 NOTE — Progress Notes (Signed)
Called to room to assist during endoscopic procedure.  Patient ID and intended procedure confirmed with present staff. Received instructions for my participation in the procedure from the performing physician.  

## 2020-12-11 NOTE — Op Note (Signed)
[facility] Patient Name: Oscar Sampson Procedure Date: 12/11/2020 7:35 AM MRN: [id number] Endoscopist: Remo Lipps P. Havery Moros , MD Age: 50 Referring MD:  Date of Birth: May 18, 1971 Gender: Male Account #: [id number] Procedure:                Colonoscopy Indications:              Screening for colorectal malignant neoplasm, This                            is the patient's first colonoscopy Medicines:                Monitored Anesthesia Care Procedure:                Pre-Anesthesia Assessment:                           - Prior to the procedure, a History and Physical                            was performed, and patient medications and                            allergies were reviewed. The patient's tolerance of                            previous anesthesia was also reviewed. The risks                            and benefits of the procedure and the sedation                            options and risks were discussed with the patient.                            All questions were answered, and informed consent                            was obtained. Prior Anticoagulants: The patient has                            taken no previous anticoagulant or antiplatelet                            agents. ASA Grade Assessment: II - A patient with                            mild systemic disease. After reviewing the risks                            and benefits, the patient was deemed in                            satisfactory condition to undergo the procedure.  After obtaining informed consent, the colonoscope                            was passed under direct vision. Throughout the                            procedure, the patient's blood pressure, pulse, and                            oxygen saturations were monitored continuously. The                            CF [id number] #[id number] was introduced through the anus                            and advanced to  the the cecum, identified by                            appendiceal orifice and ileocecal valve. The                            colonoscopy was performed without difficulty. The                            patient tolerated the procedure well. The quality                            of the bowel preparation was adequate. The                            ileocecal valve, appendiceal orifice, and rectum                            were photographed. Scope In: 8:06:43 AM Scope Out: 8:30:23 AM Scope Withdrawal Time: 0 hours 17 minutes 48 seconds  Total Procedure Duration: 0 hours 23 minutes 40 seconds  Findings:                 The perianal and digital rectal examinations were                            normal.                           Two sessile polyps were found in the cecum. The                            polyps were 4 to 10 mm in size. These polyps were                            removed with a cold snare. Resection and retrieval                            were complete.  A 4 mm polyp was found in the sigmoid colon. The                            polyp was sessile. The polyp was removed with a                            cold snare. Resection and retrieval were complete.                           Scattered medium-mouthed diverticula were found in                            the transverse colon and right colon.                           The exam was otherwise without abnormality. Complications:            No immediate complications. Estimated blood loss:                            Minimal. Estimated Blood Loss:     Estimated blood loss was minimal. Impression:               - Two 4 to 10 mm polyps in the cecum, removed with                            a cold snare. Resected and retrieved.                           - One 4 mm polyp in the sigmoid colon, removed with                            a cold snare. Resected and retrieved.                           -  Diverticulosis in the transverse colon and in the                            right colon.                           - The examination was otherwise normal. Recommendation:           - Patient has a contact number available for                            emergencies. The signs and symptoms of potential                            delayed complications were discussed with the                            patient. Return to normal activities tomorrow.  Written discharge instructions were provided to the                            patient.                           - Resume previous diet.                           - Continue present medications.                           - Await pathology results. Remo Lipps P. Emmalyne Giacomo, MD 12/11/2020 8:35:25 AM This report has been signed electronically.

## 2020-12-11 NOTE — Patient Instructions (Signed)
Handouts on polyps & diverticulosis given to you today  Await pathology results on polyps removed    YOU HAD AN ENDOSCOPIC PROCEDURE TODAY AT [facility]:   Refer to the procedure report that was given to you for any specific questions about what was found during the examination.  If the procedure report does not answer your questions, please call your gastroenterologist to clarify.  If you requested that your care partner not be given the details of your procedure findings, then the procedure report has been included in a sealed envelope for you to review at your convenience later.  YOU SHOULD EXPECT: Some feelings of bloating in the abdomen. Passage of more gas than usual.  Walking can help get rid of the air that was put into your GI tract during the procedure and reduce the bloating. If you had a lower endoscopy (such as a colonoscopy or flexible sigmoidoscopy) you may notice spotting of blood in your stool or on the toilet paper. If you underwent a bowel prep for your procedure, you may not have a normal bowel movement for a few days.  Please Note:  You might notice some irritation and congestion in your nose or some drainage.  This is from the oxygen used during your procedure.  There is no need for concern and it should clear up in a day or so.  SYMPTOMS TO REPORT IMMEDIATELY:  Following lower endoscopy (colonoscopy or flexible sigmoidoscopy):  Excessive amounts of blood in the stool  Significant tenderness or worsening of abdominal pains  Swelling of the abdomen that is new, acute  Fever of 100F or higher  For urgent or emergent issues, a gastroenterologist can be reached at any hour by calling [phone]. Do not use MyChart messaging for urgent concerns.    DIET:  We do recommend a small meal at first, but then you may proceed to your regular diet.  Drink plenty of fluids but you should avoid alcoholic beverages for 24 hours.  ACTIVITY:  You should plan to  take it easy for the rest of today and you should NOT DRIVE or use heavy machinery until tomorrow (because of the sedation medicines used during the test).    FOLLOW UP: Our staff will call the number listed on your records 48-72 hours following your procedure to check on you and address any questions or concerns that you may have regarding the information given to you following your procedure. If we do not reach you, we will leave a message.  We will attempt to reach you two times.  During this call, we will ask if you have developed any symptoms of COVID 19. If you develop any symptoms (ie: fever, flu-like symptoms, shortness of breath, cough etc.) before then, please call [phone].  If you test positive for Covid 19 in the 2 weeks post procedure, please call and report this information to [place].    If any biopsies were taken you will be contacted by phone or by letter within the next 1-3 weeks.  Please call us at [phone] if you have not heard about the biopsies in 3 weeks.    SIGNATURES/CONFIDENTIALITY: You and/or your care partner have signed paperwork which will be entered into your electronic medical record.  These signatures attest to the fact that that the information above on your After Visit Summary has been reviewed and is understood.  Full responsibility of the confidentiality of this discharge information lies with you and/or your care-partner.

## 2020-12-13 ENCOUNTER — Telehealth: Payer: Self-pay | Admitting: *Deleted

## 2020-12-13 ENCOUNTER — Telehealth: Payer: Self-pay

## 2020-12-13 NOTE — Telephone Encounter (Signed)
  Follow up Call-  Call back number 12/11/2020  Post procedure Call Back phone  # [phone]  Permission to leave phone message Yes  Some recent data might be hidden     Patient questions:  Do you have a fever, pain , or abdominal swelling? No. Pain Score  0 *  Have you tolerated food without any problems? Yes.    Have you been able to return to your normal activities? Yes.    Do you have any questions about your discharge instructions: Diet   No. Medications  No. Follow up visit  No.  Do you have questions or concerns about your Care? No.  Actions: * If pain score is 4 or above: No action needed, pain <4.  Have you developed a fever since your procedure? no  2.   Have you had an respiratory symptoms (SOB or cough) since your procedure? no  3.   Have you tested positive for COVID 19 since your procedure no  4.   Have you had any family members/close contacts diagnosed with the COVID 19 since your procedure?  no   If yes to any of these questions please route to Joylene John, RN and Joella Prince, RN

## 2020-12-13 NOTE — Telephone Encounter (Signed)
  Follow up Call-  Call back number 12/11/2020  Post procedure Call Back phone  # [phone]  Permission to leave phone message Yes  Some recent data might be hidden     Patient questions:  Do you have a fever, pain , or abdominal swelling? No. Pain Score  0 *  Have you tolerated food without any problems? Yes.    Have you been able to return to your normal activities? Yes.    Do you have any questions about your discharge instructions: Diet   No. Medications  No. Follow up visit  No.  Do you have questions or concerns about your Care? No.  Actions: * If pain score is 4 or above: No action needed, pain <4.   Have you developed a fever since your procedure? no  2.   Have you had an respiratory symptoms (SOB or cough) since your procedure? no  3.   Have you tested positive for COVID 19 since your procedure no  4.   Have you had any family members/close contacts diagnosed with the COVID 19 since your procedure?  no   If yes to any of these questions please route to Joylene John, RN and Joella Prince, RN

## 2020-12-18 ENCOUNTER — Ambulatory Visit: Payer: Self-pay

## 2020-12-18 ENCOUNTER — Ambulatory Visit: Payer: 59 | Admitting: Rheumatology

## 2020-12-18 ENCOUNTER — Encounter: Payer: Self-pay | Admitting: Rheumatology

## 2020-12-18 ENCOUNTER — Other Ambulatory Visit: Payer: Self-pay

## 2020-12-18 VITALS — BP 154/93 | HR 89 | Resp 17 | Ht 71.0 in | Wt 226.2 lb

## 2020-12-18 DIAGNOSIS — R7982 Elevated C-reactive protein (CRP): Secondary | ICD-10-CM

## 2020-12-18 DIAGNOSIS — M79641 Pain in right hand: Secondary | ICD-10-CM | POA: Diagnosis not present

## 2020-12-18 DIAGNOSIS — R700 Elevated erythrocyte sedimentation rate: Secondary | ICD-10-CM

## 2020-12-18 DIAGNOSIS — M5459 Other low back pain: Secondary | ICD-10-CM | POA: Diagnosis not present

## 2020-12-18 DIAGNOSIS — M2550 Pain in unspecified joint: Secondary | ICD-10-CM

## 2020-12-18 DIAGNOSIS — Z8261 Family history of arthritis: Secondary | ICD-10-CM

## 2020-12-18 DIAGNOSIS — M79671 Pain in right foot: Secondary | ICD-10-CM | POA: Diagnosis not present

## 2020-12-18 DIAGNOSIS — M79672 Pain in left foot: Secondary | ICD-10-CM

## 2020-12-18 DIAGNOSIS — M25561 Pain in right knee: Secondary | ICD-10-CM | POA: Diagnosis not present

## 2020-12-18 DIAGNOSIS — G8929 Other chronic pain: Secondary | ICD-10-CM | POA: Diagnosis not present

## 2020-12-18 DIAGNOSIS — E1142 Type 2 diabetes mellitus with diabetic polyneuropathy: Secondary | ICD-10-CM

## 2020-12-18 DIAGNOSIS — M25562 Pain in left knee: Secondary | ICD-10-CM | POA: Diagnosis not present

## 2020-12-18 DIAGNOSIS — R5383 Other fatigue: Secondary | ICD-10-CM

## 2020-12-18 DIAGNOSIS — M545 Low back pain, unspecified: Secondary | ICD-10-CM

## 2020-12-18 DIAGNOSIS — M79642 Pain in left hand: Secondary | ICD-10-CM | POA: Diagnosis not present

## 2020-12-18 DIAGNOSIS — E1165 Type 2 diabetes mellitus with hyperglycemia: Secondary | ICD-10-CM

## 2020-12-18 DIAGNOSIS — Z9889 Other specified postprocedural states: Secondary | ICD-10-CM

## 2020-12-19 ENCOUNTER — Telehealth: Payer: Self-pay

## 2020-12-19 NOTE — Progress Notes (Signed)
Please notify patient that glucose is elevated.  Please forward results to her PCP.  We will call her back when all the results are available.

## 2020-12-19 NOTE — Telephone Encounter (Signed)
Patient's wife Marzetta Board left a voicemail stating Eivin had an appointment with Dr. Estanislado Pandy yesterday, 12/18/20.  Stacy states "I read over his Mychart and have some concerns and he does also.  I don't know if he has a diagnosis, but I need some explanation and plan of care in regards to what was stated on Mychart."  Please call back at [phone]

## 2020-12-19 NOTE — Telephone Encounter (Signed)
Spoke with patient and advised we do not have a signed dpr and I am unable to speak with his wife. Patient states his wife had some concerns after reading over the AVS. Patient advised that he has a follow up visit scheduled for 01/15/2021. At that time Dr. Estanislado Pandy will discuss all lab results and will go over the plan of care. Patient advised that the plan of care happens after we have all labs and imaging back. Patient expressed understanding.

## 2020-12-23 LAB — 14-3-3 ETA PROTEIN: 14-3-3 eta Protein: <0.2 ng/mL (ref <0.2)

## 2020-12-23 LAB — COMPLETE METABOLIC PANEL WITH GFR
AG Ratio: 1.5 (calc) (ref 1.0–2.5)
ALT: 15 U/L (ref 9–46)
AST: 12 U/L (ref 10–40)
Albumin: 4.4 g/dL (ref 3.6–5.1)
Alkaline phosphatase (APISO): 64 U/L (ref 36–130)
BUN: 10 mg/dL (ref 7–25)
CO2: 29 mmol/L (ref 20–32)
Calcium: 9.9 mg/dL (ref 8.6–10.3)
Chloride: 104 mmol/L (ref 98–110)
Creat: 1.16 mg/dL (ref 0.60–1.35)
GFR, Est African American: 85 mL/min/{1.73_m2} (ref >=60)
GFR, Est Non African American: 74 mL/min/{1.73_m2} (ref >=60)
Globulin: 3 g/dL (calc) (ref 1.9–3.7)
Glucose, Bld: 175 mg/dL — ABNORMAL HIGH (ref 65–99)
Potassium: 4.3 mmol/L (ref 3.5–5.3)
Sodium: 142 mmol/L (ref 135–146)
Total Bilirubin: 0.3 mg/dL (ref 0.2–1.2)
Total Protein: 7.4 g/dL (ref 6.1–8.1)

## 2020-12-23 LAB — CBC WITH DIFFERENTIAL/PLATELET
Absolute Monocytes: 427 cells/uL (ref 200–950)
Basophils Absolute: 40 cells/uL (ref 0–200)
Basophils Relative: 0.5 %
Eosinophils Absolute: 221 cells/uL (ref 15–500)
Eosinophils Relative: 2.8 %
HCT: 42.4 % (ref 38.5–50.0)
Hemoglobin: 13.8 g/dL (ref 13.2–17.1)
Lymphs Abs: 1857 cells/uL (ref 850–3900)
MCH: 27.7 pg (ref 27.0–33.0)
MCHC: 32.5 g/dL (ref 32.0–36.0)
MCV: 85 fL (ref 80.0–100.0)
MPV: 9.1 fL (ref 7.5–12.5)
Monocytes Relative: 5.4 %
Neutro Abs: 5356 cells/uL (ref 1500–7800)
Neutrophils Relative %: 67.8 %
Platelets: 359 10*3/uL (ref 140–400)
RBC: 4.99 10*6/uL (ref 4.20–5.80)
RDW: 14.8 % (ref 11.0–15.0)
Total Lymphocyte: 23.5 %
WBC: 7.9 10*3/uL (ref 3.8–10.8)

## 2020-12-23 LAB — PROTEIN ELECTROPHORESIS, SERUM, WITH REFLEX
Albumin ELP: 4.4 g/dL (ref 3.8–4.8)
Alpha 1: 0.2 g/dL (ref 0.2–0.3)
Alpha 2: 0.8 g/dL (ref 0.5–0.9)
Beta 2: 0.4 g/dL (ref 0.2–0.5)
Beta Globulin: 0.3 g/dL — ABNORMAL LOW (ref 0.4–0.6)
Gamma Globulin: 1.2 g/dL (ref 0.8–1.7)
Total Protein: 7.3 g/dL (ref 6.1–8.1)

## 2020-12-23 LAB — ANGIOTENSIN CONVERTING ENZYME: Angiotensin-Converting Enzyme: 13 U/L (ref 9–67)

## 2020-12-23 LAB — CK: Total CK: 149 U/L (ref 44–196)

## 2020-12-23 LAB — GLUCOSE 6 PHOSPHATE DEHYDROGENASE: G-6PDH: 15.3 U/g Hgb (ref 7.0–20.5)

## 2020-12-23 LAB — IGG, IGA, IGM
IgG (Immunoglobin G), Serum: 1475 mg/dL (ref 600–1640)
IgM, Serum: 101 mg/dL (ref 50–300)
Immunoglobulin A: 286 mg/dL (ref 47–310)

## 2020-12-23 LAB — SEDIMENTATION RATE: Sed Rate: 19 mm/h — ABNORMAL HIGH (ref 0–15)

## 2020-12-23 LAB — CYCLIC CITRUL PEPTIDE ANTIBODY, IGG: Cyclic Citrullin Peptide Ab: <16 UNITS

## 2020-12-24 NOTE — Progress Notes (Signed)
CBC normal, sed rate mildly elevated, CK normal, anti-CCP negative, 14 3 3  eta negative, ACE negative,G6PD normal, SPEP normal, immunoglobulins normal.  I will discuss results at the follow-up visit.

## 2021-01-05 NOTE — Progress Notes (Signed)
Office Visit Note  Patient: Oscar TINKHAM Sr.             Date of Birth: [birth year]           MRN: [id number]             PCP: Jolinda Croak, MD Referring: Tommy Medal, MD Visit Date: 01/15/2021 Occupation: @GUAROCC @  Subjective:  Pain in multiple joint.   History of Present Illness: Oscar L Horton Ell[place]orpe. is a 50 y.o. male with history of pain in multiple joints.  He was accompanied by his wife today.  He states he continues to have pain and stiffness in his bilateral hands and his knee joints.  He gets very stiff after prolonged sitting.  He states that he has intermittent swelling in his hands.  Activities of Daily Living:  Patient reports morning stiffness for all day. Patient Denies nocturnal pain.  Difficulty dressing/grooming: Reports Difficulty climbing stairs: Reports Difficulty getting out of chair: Reports Difficulty using hands for taps, buttons, cutlery, and/or writing: Reports  Review of Systems  Constitutional:  Negative for fatigue.  HENT:  Negative for mouth sores, mouth dryness and nose dryness.   Eyes:  Negative for pain, itching and dryness.  Respiratory:  Negative for shortness of breath and difficulty breathing.   Cardiovascular:  Negative for chest pain and palpitations.  Gastrointestinal:  Positive for diarrhea. Negative for blood in stool and constipation.  Endocrine: Negative for increased urination.  Musculoskeletal:  Positive for joint pain, joint pain, joint swelling, myalgias, morning stiffness, muscle tenderness and myalgias.  Skin:  Negative for color change, rash and redness.  Allergic/Immunologic: Negative for susceptible to infections.  Neurological:  Positive for numbness. Negative for dizziness, headaches, memory loss and weakness.  Hematological:  Negative for bruising/bleeding tendency.  Psychiatric/Behavioral:  Negative for confusion.    PMFS History:  Patient Active Problem List   Diagnosis Date Noted   CAP (community  acquired pneumonia) 01/05/2015   Leukocytosis 01/05/2015   Diabetes type 2, uncontrolled ([place]) 01/05/2015   Elevated troponin I level 01/05/2015   PNA (pneumonia) 01/05/2015   S/P arthroscopy of shoulder 12/05/2013    Past Medical History:  Diagnosis Date   At risk for sleep apnea    STOP-BANG= 4    SENT TO PCP  12-01-2013   Diabetes mellitus 2012    Family History  Problem Relation Age of Onset   Diabetes Mother    Hypertension Father    Healthy Sister    Healthy Sister    Healthy Sister    Healthy Brother    Diabetes Brother    Healthy Brother    Healthy Brother    Healthy Brother    Healthy Brother    Healthy Daughter    Healthy Son    Colon cancer Neg Hx    Colon polyps Neg Hx    Esophageal cancer Neg Hx    Stomach cancer Neg Hx    Rectal cancer Neg Hx    Past Surgical History:  Procedure Laterality Date   COLONOSCOPY  2022   SHOULDER ARTHROSCOPY WITH SUBACROMIAL DECOMPRESSION, ROTATOR CUFF REPAIR AND BICEP TENDON REPAIR Right 12/05/2013   Procedure: RGHT SHOULDER ARTHROSCOPY WITH SUBACROMIAL DECOMPRESSION,DISTAL CLAVICLE RESECTION, LABRAL DEBRIDEMENT ;  Surgeon: Sydnee Cabal, MD;  Location: [facility];  Service: Orthopedics;  Laterality: Right;   SHOULDER SURGERY Right 2016   2nd time 2016   SHOULDER SURGERY Left 2018   Social History   Social History Narrative  Not on file   Immunization History  Administered Date(s) Administered   PFIZER(Purple Top)SARS-COV-2 Vaccination 04/30/2020, 05/21/2020     Objective: Vital Signs: BP 139/86 (BP Location: Right Arm, Patient Position: Sitting, Cuff Size: Normal)   Pulse (!) 101   Resp 17   Ht 5' 11"  (1.803 m)   Wt 220 lb 9.6 oz (100.1 kg)   BMI 30.77 kg/m    Physical Exam Vitals and nursing note reviewed.  Constitutional:      Appearance: He is well-developed.  HENT:     Head: Normocephalic and atraumatic.  Eyes:     Conjunctiva/sclera: Conjunctivae normal.     Pupils: Pupils are  equal, round, and reactive to light.  Cardiovascular:     Rate and Rhythm: Normal rate and regular rhythm.     Heart sounds: Normal heart sounds.  Pulmonary:     Effort: Pulmonary effort is normal.     Breath sounds: Normal breath sounds.  Abdominal:     General: Bowel sounds are normal.     Palpations: Abdomen is soft.  Musculoskeletal:     Cervical back: Normal range of motion and neck supple.  Skin:    General: Skin is warm and dry.     Capillary Refill: Capillary refill takes less than 2 seconds.  Neurological:     Mental Status: He is alert and oriented to person, place, and time.  Psychiatric:        Behavior: Behavior normal.     Musculoskeletal Exam: C-spine was in good range of motion.  She discomfort range of motion of her lumbar spine.  Shoulder joints, elbow joints, wrist joints with good range of motion.  He had bilateral PIP and DIP thickening with no synovitis.  Hip joints, knee joints, ankles, MTPs and PIPs with good range of motion with no synovitis.  CDAI Exam: CDAI Score: -- Patient Global: --; Provider Global: -- Swollen: --; Tender: -- Joint Exam 01/15/2021   No joint exam has been documented for this visit   There is currently no information documented on the homunculus. Go to the Rheumatology activity and complete the homunculus joint exam.  Investigation: No additional findings.  Imaging: XR Foot 2 Views Left  Result Date: 12/18/2020 First MTP, PIP and DIP narrowing was noted.  No intertarsal, tibiotalar or subtalar joint space narrowing was noted.  No erosive changes were noted. Impression: These findings are consistent with osteoarthritis of the foot.  XR Foot 2 Views Right  Result Date: 12/18/2020 Mild PIP and DIP narrowing was noted.  No intertarsal, tibiotalar or subtalar joint space narrowing was noted.  No erosive changes were noted. Impression: These findings are consistent with early osteoarthritis of the foot.  XR Hand 2 View Left  Result  Date: 12/18/2020 CMC, PIP and DIP narrowing was noted.  No MCP, intercarpal or radiocarpal joint space narrowing was noted.  No erosive changes were noted. Impression: These findings are consistent with osteoarthritis of the hand.  XR Hand 2 View Right  Result Date: 12/18/2020 First MCP subluxation was noted.  No MCP narrowing was noted.  PIP and DIP narrowing was noted.  No intercarpal or radiocarpal joint space narrowing was noted.  No erosive changes were noted. Impression: These findings are consistent with osteoarthritis of the hand.  XR KNEE 3 VIEW LEFT  Result Date: 12/18/2020 No medial or lateral compartment narrowing was noted.  No patellofemoral narrowing was noted.  No chondrocalcinosis was noted. Impression: Unremarkable x-ray of the knee.  XR KNEE  3 VIEW RIGHT  Result Date: 12/18/2020 Mild medial compartment narrowing was noted.  No lateral compartment narrowing was noted.  No patellofemoral narrowing was noted.  No chondrocalcinosis was noted. Impression: Mild osteoarthritis of the knee joint was noted.  XR Lumbar Spine 2-3 Views  Result Date: 12/18/2020 Anterior spurring was noted.  Facet joint arthropathy was noted.  No significant disc space narrowing was noted. Impression: These findings are consistent with degenerative changes of the lumbar spine with facet joint arthropathy.   Recent Labs: Lab Results  Component Value Date   WBC 7.9 12/18/2020   HGB 13.8 12/18/2020   PLT 359 12/18/2020   NA 142 12/18/2020   K 4.3 12/18/2020   CL 104 12/18/2020   CO2 29 12/18/2020   GLUCOSE 175 (H) 12/18/2020   BUN 10 12/18/2020   CREATININE 1.16 12/18/2020   BILITOT 0.3 12/18/2020   ALKPHOS 69 01/06/2015   AST 12 12/18/2020   ALT 15 12/18/2020   PROT 7.4 12/18/2020   PROT 7.3 12/18/2020   ALBUMIN 3.2 (L) 01/06/2015   CALCIUM 9.9 12/18/2020   GFRAA 85 12/18/2020   December 18, 2020 SPEP normal, immunoglobulins normal, ESR 19, CK14 9, anti-CCP negative, 14 3 3  eta negative, ACE  negative, G6PD normal   Speciality Comments: No specialty comments available.  Procedures:  No procedures performed Allergies: Neosporin [neomycin-bacitracin zn-polymyx]   Assessment / Plan:     Visit Diagnoses: Polyarthralgia - History of pain in multiple joints.  All autoimmune work-up was negative.  I discussed the option of obtaining ultrasound of bilateral hands but he declined.  I advised him to contact me in case he develops increased swelling then we can schedule ultrasound of bilateral hands.    S/P arthroscopy of bilateral shoulder-he has chronic discomfort.  Primary osteoarthritis of both hands-he has pain in bilateral hands.  No synovitis was noted.  X-ray findings and lab findings were discussed with the length.  I advised him to contact me in case he wants to do ultrasound of bilateral hands.  I will refer him to PT and OT.  Primary osteoarthritis of right knee - X-ray of the right knee joint showed mild osteoarthritis.  Left knee joint x-ray was unremarkable.  X-ray findings were discussed with the patient and his wife.  A handout on knee joint exercises was given.  He was referred for physical therapy.  A handout on exercises was given.  Natural anti-inflammatories were discussed.  Primary osteoarthritis of both feet - X-ray showed early osteoarthritic changes.  X-ray findings were discussed.  DDD (degenerative disc disease), lumbar -x-rays showed degenerative changes and facet joint arthropathy.  X-ray findings were discussed.  A handout on exercises was given.  Elevated sed rate - Repeat sed rate is almost normal.  Uncontrolled type 2 diabetes mellitus with hyperglycemia (HCC)  Type 2 diabetes mellitus with diabetic polyneuropathy, without long-term current use of insulin (HCC)  Family history of rheumatoid arthritis - Sister  Orders: Orders Placed This Encounter  Procedures   Ambulatory referral to Physical Therapy    No orders of the defined types were placed  in this encounter.    Follow-Up Instructions: Return in about 6 months (around 07/18/2021) for Osteoarthritis.   Bo Merino, MD  Note - This record has been created using Editor, commissioning.  Chart creation errors have been sought, but may not always  have been located. Such creation errors do not reflect on  the standard of medical care.

## 2021-01-10 ENCOUNTER — Telehealth: Payer: Self-pay | Admitting: Rheumatology

## 2021-01-10 NOTE — Telephone Encounter (Signed)
Patient calling in reference to follow up appointment on 01/15/21. 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 Patient is not sure he will be able to make it back for follow up. Due to doctor's schedule, rescheduling patient's appointment will not be soon. Patient would like to know if he can due a virtual appointment for follow up? Please call to advise.

## 2021-01-10 NOTE — Telephone Encounter (Signed)
OK to virtual visit?

## 2021-01-13 NOTE — Telephone Encounter (Signed)
Yes, we may schedule a virtual visit.  Although, an urgent follow up is not needed.  If you would like to be seen in person we can schedule a visit at his convenience.

## 2021-01-14 NOTE — Telephone Encounter (Signed)
I called patient, patient may be in town for appt, patient will call EOD on 01/14/2021 to advise.

## 2021-01-15 ENCOUNTER — Encounter: Payer: Self-pay | Admitting: Rheumatology

## 2021-01-15 ENCOUNTER — Ambulatory Visit (INDEPENDENT_AMBULATORY_CARE_PROVIDER_SITE_OTHER): Payer: 59 | Admitting: Rheumatology

## 2021-01-15 ENCOUNTER — Other Ambulatory Visit: Payer: Self-pay

## 2021-01-15 VITALS — BP 139/86 | HR 101 | Resp 17 | Ht 71.0 in | Wt 220.6 lb

## 2021-01-15 DIAGNOSIS — M51369 Other intervertebral disc degeneration, lumbar region without mention of lumbar back pain or lower extremity pain: Secondary | ICD-10-CM

## 2021-01-15 DIAGNOSIS — M5136 Other intervertebral disc degeneration, lumbar region: Secondary | ICD-10-CM

## 2021-01-15 DIAGNOSIS — M2550 Pain in unspecified joint: Secondary | ICD-10-CM | POA: Diagnosis not present

## 2021-01-15 DIAGNOSIS — M1711 Unilateral primary osteoarthritis, right knee: Secondary | ICD-10-CM

## 2021-01-15 DIAGNOSIS — M19071 Primary osteoarthritis, right ankle and foot: Secondary | ICD-10-CM

## 2021-01-15 DIAGNOSIS — Z9889 Other specified postprocedural states: Secondary | ICD-10-CM | POA: Diagnosis not present

## 2021-01-15 DIAGNOSIS — R700 Elevated erythrocyte sedimentation rate: Secondary | ICD-10-CM

## 2021-01-15 DIAGNOSIS — M19041 Primary osteoarthritis, right hand: Secondary | ICD-10-CM | POA: Diagnosis not present

## 2021-01-15 DIAGNOSIS — E1142 Type 2 diabetes mellitus with diabetic polyneuropathy: Secondary | ICD-10-CM

## 2021-01-15 DIAGNOSIS — M19072 Primary osteoarthritis, left ankle and foot: Secondary | ICD-10-CM

## 2021-01-15 DIAGNOSIS — Z8261 Family history of arthritis: Secondary | ICD-10-CM

## 2021-01-15 DIAGNOSIS — M19042 Primary osteoarthritis, left hand: Secondary | ICD-10-CM

## 2021-01-15 DIAGNOSIS — E1165 Type 2 diabetes mellitus with hyperglycemia: Secondary | ICD-10-CM

## 2021-01-15 NOTE — Patient Instructions (Addendum)
Journal for Nurse Practitioners, 15(4), 263-267. Retrieved March 21, 2018 from [email or web address]">  Knee Exercises Ask your health care provider which exercises are safe for you. Do exercises exactly as told by your health care provider and adjust them as directed. It is normal to feel mild stretching, pulling, tightness, or discomfort as you do these exercises. Stop right away if you feel sudden pain or your pain gets worse. Do not begin these exercises until told by your health care provider. Stretching and range-of-motion exercises These exercises warm up your muscles and joints and improve the movement and flexibility of your knee. These exercises also help to relieve pain andswelling. Knee extension, prone Lie on your abdomen (prone position) on a bed. Place your left / right knee just beyond the edge of the surface so your knee is not on the bed. You can put a towel under your left / right thigh just above your kneecap for comfort. Relax your leg muscles and allow gravity to straighten your knee (extension). You should feel a stretch behind your left / right knee. Hold this position for __________ seconds. Scoot up so your knee is supported between repetitions. Repeat __________ times. Complete this exercise __________ times a day. Knee flexion, active  Lie on your back with both legs straight. If this causes back discomfort, bend your left / right knee so your foot is flat on the floor. Slowly slide your left / right heel back toward your buttocks. Stop when you feel a gentle stretch in the front of your knee or thigh (flexion). Hold this position for __________ seconds. Slowly slide your left / right heel back to the starting position. Repeat __________ times. Complete this exercise __________ times a day. Quadriceps stretch, prone  Lie on your abdomen on a firm surface, such as a bed or padded floor. Bend your left / right knee and hold your ankle. If you cannot reach  your ankle or pant leg, loop a belt around your foot and grab the belt instead. Gently pull your heel toward your buttocks. Your knee should not slide out to the side. You should feel a stretch in the front of your thigh and knee (quadriceps). Hold this position for __________ seconds. Repeat __________ times. Complete this exercise __________ times a day. Hamstring, supine Lie on your back (supine position). Loop a belt or towel over the ball of your left / right foot. The ball of your foot is on the walking surface, right under your toes. Straighten your left / right knee and slowly pull on the belt to raise your leg until you feel a gentle stretch behind your knee (hamstring). Do not let your knee bend while you do this. Keep your other leg flat on the floor. Hold this position for __________ seconds. Repeat __________ times. Complete this exercise __________ times a day. Strengthening exercises These exercises build strength and endurance in your knee. Endurance is theability to use your muscles for a long time, even after they get tired. Quadriceps, isometric This exercise stretches the muscles in front of your thigh (quadriceps) without moving your knee joint (isometric). Lie on your back with your left / right leg extended and your other knee bent. Put a rolled towel or small pillow under your knee if told by your health care provider. Slowly tense the muscles in the front of your left / right thigh. You should see your kneecap slide up toward your hip or see increased dimpling just above the knee. This motion will   push the back of the knee toward the floor. For __________ seconds, hold the muscle as tight as you can without increasing your pain. Relax the muscles slowly and completely. Repeat __________ times. Complete this exercise __________ times a day. Straight leg raises This exercise stretches the muscles in front of your thigh (quadriceps) and the muscles that move your hips (hip  flexors). Lie on your back with your left / right leg extended and your other knee bent. Tense the muscles in the front of your left / right thigh. You should see your kneecap slide up or see increased dimpling just above the knee. Your thigh may even shake a bit. Keep these muscles tight as you raise your leg 4-6 inches (10-15 cm) off the floor. Do not let your knee bend. Hold this position for __________ seconds. Keep these muscles tense as you lower your leg. Relax your muscles slowly and completely after each repetition. Repeat __________ times. Complete this exercise __________ times a day. Hamstring, isometric Lie on your back on a firm surface. Bend your left / right knee about __________ degrees. Dig your left / right heel into the surface as if you are trying to pull it toward your buttocks. Tighten the muscles in the back of your thighs (hamstring) to "dig" as hard as you can without increasing any pain. Hold this position for __________ seconds. Release the tension gradually and allow your muscles to relax completely for __________ seconds after each repetition. Repeat __________ times. Complete this exercise __________ times a day. Hamstring curls If told by your health care provider, do this exercise while wearing ankle weights. Begin with __________ lb weights. Then increase the weight by 1 lb (0.5 kg) increments. Do not wear ankle weights that are more than __________ lb. Lie on your abdomen with your legs straight. Bend your left / right knee as far as you can without feeling pain. Keep your hips flat against the floor. Hold this position for __________ seconds. Slowly lower your leg to the starting position. Repeat __________ times. Complete this exercise __________ times a day. Squats This exercise strengthens the muscles in front of your thigh and knee (quadriceps). Stand in front of a table, with your feet and knees pointing straight ahead. You may rest your hands on the  table for balance but not for support. Slowly bend your knees and lower your hips like you are going to sit in a chair. Keep your weight over your heels, not over your toes. Keep your lower legs upright so they are parallel with the table legs. Do not let your hips go lower than your knees. Do not bend lower than told by your health care provider. If your knee pain increases, do not bend as low. Hold the squat position for __________ seconds. Slowly push with your legs to return to standing. Do not use your hands to pull yourself to standing. Repeat __________ times. Complete this exercise __________ times a day. Wall slides This exercise strengthens the muscles in front of your thigh and knee (quadriceps). Lean your back against a smooth wall or door, and walk your feet out 18-24 inches (46-61 cm) from it. Place your feet hip-width apart. Slowly slide down the wall or door until your knees bend __________ degrees. Keep your knees over your heels, not over your toes. Keep your knees in line with your hips. Hold this position for __________ seconds. Repeat __________ times. Complete this exercise __________ times a day. Straight leg raises This exercise   strengthens the muscles that rotate the leg at the hip and move it away from your body (hip abductors). Lie on your side with your left / right leg in the top position. Lie so your head, shoulder, knee, and hip line up. You may bend your bottom knee to help you keep your balance. Roll your hips slightly forward so your hips are stacked directly over each other and your left / right knee is facing forward. Leading with your heel, lift your top leg 4-6 inches (10-15 cm). You should feel the muscles in your outer hip lifting. Do not let your foot drift forward. Do not let your knee roll toward the ceiling. Hold this position for __________ seconds. Slowly return your leg to the starting position. Let your muscles relax completely after each  repetition. Repeat __________ times. Complete this exercise __________ times a day. Straight leg raises This exercise stretches the muscles that move your hips away from the front of the pelvis (hip extensors). Lie on your abdomen on a firm surface. You can put a pillow under your hips if that is more comfortable. Tense the muscles in your buttocks and lift your left / right leg about 4-6 inches (10-15 cm). Keep your knee straight as you lift your leg. Hold this position for __________ seconds. Slowly lower your leg to the starting position. Let your leg relax completely after each repetition. Repeat __________ times. Complete this exercise __________ times a day. This information is not intended to replace advice given to you by your health care provider. Make sure you discuss any questions you have with your healthcare provider. Document Revised: 03/22/2018 Document Reviewed: 03/22/2018 [employer] Patient Education  2022 [employer] Hand Exercises Hand exercises can be helpful for almost anyone. These exercises can strengthen the hands, improve flexibility and movement, and increase blood flow to the hands. These results can make work and daily tasks easier. Hand exercises can be especially helpful for people who have joint pain from arthritis or have nerve damage from overuse (carpal tunnel syndrome). These exercises can also help people who have injured a hand. Exercises Most of these hand exercises are gentle stretching and motion exercises. It is usually safe to do them often throughout the day. Warming up your hands before exercise may help to reduce stiffness. You can do this with gentle massage orby placing your hands in warm water for 10-15 minutes. It is normal to feel some stretching, pulling, tightness, or mild discomfort as you begin new exercises. This will gradually improve. Stop an exercise right away if you feel sudden, severe pain or your pain gets worse. Ask your healthcare  provider which exercises are best for you. Knuckle bend or "claw" fist Stand or sit with your arm, hand, and all five fingers pointed straight up. Make sure to keep your wrist straight during the exercise. Gently bend your fingers down toward your palm until the tips of your fingers are touching the top of your palm. Keep your big knuckle straight and just bend the small knuckles in your fingers. Hold this position for __________ seconds. Straighten (extend) your fingers back to the starting position. Repeat this exercise 5-10 times with each hand. Full finger fist Stand or sit with your arm, hand, and all five fingers pointed straight up. Make sure to keep your wrist straight during the exercise. Gently bend your fingers into your palm until the tips of your fingers are touching the middle of your palm. Hold this position for __________ seconds.   Extend your fingers back to the starting position, stretching every joint fully. Repeat this exercise 5-10 times with each hand. Straight fist Stand or sit with your arm, hand, and all five fingers pointed straight up. Make sure to keep your wrist straight during the exercise. Gently bend your fingers at the big knuckle, where your fingers meet your hand, and the middle knuckle. Keep the knuckle at the tips of your fingers straight and try to touch the bottom of your palm. Hold this position for __________ seconds. Extend your fingers back to the starting position, stretching every joint fully. Repeat this exercise 5-10 times with each hand. Tabletop Stand or sit with your arm, hand, and all five fingers pointed straight up. Make sure to keep your wrist straight during the exercise. Gently bend your fingers at the big knuckle, where your fingers meet your hand, as far down as you can while keeping the small knuckles in your fingers straight. Think of forming a tabletop with your fingers. Hold this position for __________ seconds. Extend your fingers  back to the starting position, stretching every joint fully. Repeat this exercise 5-10 times with each hand. Finger spread Place your hand flat on a table with your palm facing down. Make sure your wrist stays straight as you do this exercise. Spread your fingers and thumb apart from each other as far as you can until you feel a gentle stretch. Hold this position for __________ seconds. Bring your fingers and thumb tight together again. Hold this position for __________ seconds. Repeat this exercise 5-10 times with each hand. Making circles Stand or sit with your arm, hand, and all five fingers pointed straight up. Make sure to keep your wrist straight during the exercise. Make a circle by touching the tip of your thumb to the tip of your index finger. Hold for __________ seconds. Then open your hand wide. Repeat this motion with your thumb and each finger on your hand. Repeat this exercise 5-10 times with each hand. Thumb motion Sit with your forearm resting on a table and your wrist straight. Your thumb should be facing up toward the ceiling. Keep your fingers relaxed as you move your thumb. Lift your thumb up as high as you can toward the ceiling. Hold for __________ seconds. Bend your thumb across your palm as far as you can, reaching the tip of your thumb for the small finger (pinkie) side of your palm. Hold for __________ seconds. Repeat this exercise 5-10 times with each hand. Grip strengthening  Hold a stress ball or other soft ball in the middle of your hand. Slowly increase the pressure, squeezing the ball as much as you can without causing pain. Think of bringing the tips of your fingers into the middle of your palm. All of your finger joints should bend when doing this exercise. Hold your squeeze for __________ seconds, then relax. Repeat this exercise 5-10 times with each hand. Contact a health care provider if: Your hand pain or discomfort gets much worse when you do an  exercise. Your hand pain or discomfort does not improve within 2 hours after you exercise. If you have any of these problems, stop doing these exercises right away. Do not do them again unless your health care provider says that you can. Get help right away if: You develop sudden, severe hand pain or swelling. If this happens, stop doing these exercises right away. Do not do them again unless your health care provider says that you can. This   information is not intended to replace advice given to you by your health care provider. Make sure you discuss any questions you have with your healthcare provider. Document Revised: 09/22/2018 Document Reviewed: 06/02/2018 [employer] Patient Education  [place]. Back Exercises The following exercises strengthen the muscles that help to support the trunk and back. They also help to keep the lower back flexible. Doing these exercises can help to prevent back pain or lessen existing pain. If you have back pain or discomfort, try doing these exercises 2-3 times each day or as told by your health care provider. As your pain improves, do them once each day, but increase the number of times that you repeat the steps for each exercise (do more repetitions). To prevent the recurrence of back pain, continue to do these exercises once each day or as told by your health care provider. Do exercises exactly as told by your health care provider and adjust them as directed. It is normal to feel mild stretching, pulling, tightness, or discomfort as you do these exercises, but you should stop right away if youfeel sudden pain or your pain gets worse. Exercises Single knee to chest Repeat these steps 3-5 times for each leg: Lie on your back on a firm bed or the floor with your legs extended. Bring one knee to your chest. Your other leg should stay extended and in contact with the floor. Hold your knee in place by grabbing your knee or thigh with both hands and  hold. Pull on your knee until you feel a gentle stretch in your lower back or buttocks. Hold the stretch for 10-30 seconds. Slowly release and straighten your leg. Pelvic tilt Repeat these steps 5-10 times: Lie on your back on a firm bed or the floor with your legs extended. Bend your knees so they are pointing toward the ceiling and your feet are flat on the floor. Tighten your lower abdominal muscles to press your lower back against the floor. This motion will tilt your pelvis so your tailbone points up toward the ceiling instead of pointing to your feet or the floor. With gentle tension and even breathing, hold this position for 5-10 seconds. Cat-cow Repeat these steps until your lower back becomes more flexible: Get into a hands-and-knees position on a firm surface. Keep your hands under your shoulders, and keep your knees under your hips. You may place padding under your knees for comfort. Let your head hang down toward your chest. Contract your abdominal muscles and point your tailbone toward the floor so your lower back becomes rounded like the back of a cat. Hold this position for 5 seconds. Slowly lift your head, let your abdominal muscles relax and point your tailbone up toward the ceiling so your back forms a sagging arch like the back of a cow. Hold this position for 5 seconds.  Press-ups Repeat these steps 5-10 times: Lie on your abdomen (face-down) on the floor. Place your palms near your head, about shoulder-width apart. Keeping your back as relaxed as possible and keeping your hips on the floor, slowly straighten your arms to raise the top half of your body and lift your shoulders. Do not use your back muscles to raise your upper torso. You may adjust the placement of your hands to make yourself more comfortable. Hold this position for 5 seconds while you keep your back relaxed. Slowly return to lying flat on the floor.  Bridges Repeat these steps 10 times: Lie on your  back  on a firm surface. Bend your knees so they are pointing toward the ceiling and your feet are flat on the floor. Your arms should be flat at your sides, next to your body. Tighten your buttocks muscles and lift your buttocks off the floor until your waist is at almost the same height as your knees. You should feel the muscles working in your buttocks and the back of your thighs. If you do not feel these muscles, slide your feet 1-2 inches farther away from your buttocks. Hold this position for 3-5 seconds. Slowly lower your hips to the starting position, and allow your buttocks muscles to relax completely. If this exercise is too easy, try doing it with your arms crossed over yourchest. Abdominal crunches Repeat these steps 5-10 times: Lie on your back on a firm bed or the floor with your legs extended. Bend your knees so they are pointing toward the ceiling and your feet are flat on the floor. Cross your arms over your chest. Tip your chin slightly toward your chest without bending your neck. Tighten your abdominal muscles and slowly raise your trunk (torso) high enough to lift your shoulder blades a tiny bit off the floor. Avoid raising your torso higher than that because it can put too much stress on your low back and does not help to strengthen your abdominal muscles. Slowly return to your starting position. Back lifts Repeat these steps 5-10 times: Lie on your abdomen (face-down) with your arms at your sides, and rest your forehead on the floor. Tighten the muscles in your legs and your buttocks. Slowly lift your chest off the floor while you keep your hips pressed to the floor. Keep the back of your head in line with the curve in your back. Your eyes should be looking at the floor. Hold this position for 3-5 seconds. Slowly return to your starting position. Contact a health care provider if: Your back pain or discomfort gets much worse when you do an exercise. Your worsening back  pain or discomfort does not lessen within 2 hours after you exercise. If you have any of these problems, stop doing these exercises right away. Do not do them again unless your health care provider says that you can. Get help right away if: You develop sudden, severe back pain. If this happens, stop doing the exercises right away. Do not do them again unless your health care provider says that you can. This information is not intended to replace advice given to you by your health care provider. Make sure you discuss any questions you have with your healthcare provider. Document Revised: 10/06/2018 Document Reviewed: 03/03/2018 Elsevier Patient Education  [place].

## 2021-01-23 ENCOUNTER — Ambulatory Visit (HOSPITAL_BASED_OUTPATIENT_CLINIC_OR_DEPARTMENT_OTHER): Payer: 59 | Admitting: Physical Therapy

## 2021-01-27 ENCOUNTER — Other Ambulatory Visit: Payer: Self-pay

## 2021-01-27 ENCOUNTER — Encounter (HOSPITAL_BASED_OUTPATIENT_CLINIC_OR_DEPARTMENT_OTHER): Payer: Self-pay | Admitting: Physical Therapy

## 2021-01-27 ENCOUNTER — Ambulatory Visit (HOSPITAL_BASED_OUTPATIENT_CLINIC_OR_DEPARTMENT_OTHER): Payer: 59 | Attending: Rheumatology | Admitting: Physical Therapy

## 2021-01-27 DIAGNOSIS — M79641 Pain in right hand: Secondary | ICD-10-CM | POA: Diagnosis present

## 2021-01-27 DIAGNOSIS — M25632 Stiffness of left wrist, not elsewhere classified: Secondary | ICD-10-CM

## 2021-01-27 DIAGNOSIS — M25561 Pain in right knee: Secondary | ICD-10-CM | POA: Insufficient documentation

## 2021-01-27 DIAGNOSIS — M25531 Pain in right wrist: Secondary | ICD-10-CM | POA: Insufficient documentation

## 2021-01-27 DIAGNOSIS — M25631 Stiffness of right wrist, not elsewhere classified: Secondary | ICD-10-CM

## 2021-01-27 DIAGNOSIS — M6281 Muscle weakness (generalized): Secondary | ICD-10-CM

## 2021-01-27 DIAGNOSIS — G8929 Other chronic pain: Secondary | ICD-10-CM | POA: Diagnosis present

## 2021-01-27 DIAGNOSIS — R262 Difficulty in walking, not elsewhere classified: Secondary | ICD-10-CM | POA: Diagnosis present

## 2021-01-27 DIAGNOSIS — M79642 Pain in left hand: Secondary | ICD-10-CM

## 2021-01-27 NOTE — Therapy (Addendum)
[facility] [address] [place], [place], [address] Phone: [phone]   Fax:  [phone]  Physical Therapy Evaluation  Patient Details  Name: Oscar ALBANEZ Sr. MRN: [id number] Date of Birth: [birth year] Referring Provider (PT): Bo Merino, MD   Encounter Date: 01/27/2021   PT End of Session - 01/27/21 2146     Visit Number 1    Number of Visits 12    Date for PT Re-Evaluation 02/24/21    Progress Note Due on Visit --   02/24/2021   PT Start Time 0812    PT Stop Time 0906    PT Time Calculation (min) 54 min    Activity Tolerance Patient tolerated treatment well    Behavior During Therapy [facility] for tasks assessed/performed             Past Medical History:  Diagnosis Date   At risk for sleep apnea    STOP-BANG= 4    SENT TO PCP  12-01-2013   Diabetes mellitus 2012    Past Surgical History:  Procedure Laterality Date   COLONOSCOPY  2022   SHOULDER ARTHROSCOPY WITH SUBACROMIAL DECOMPRESSION, ROTATOR CUFF REPAIR AND BICEP TENDON REPAIR Right 12/05/2013   Procedure: RGHT SHOULDER ARTHROSCOPY WITH SUBACROMIAL DECOMPRESSION,DISTAL CLAVICLE RESECTION, LABRAL DEBRIDEMENT ;  Surgeon: Sydnee Cabal, MD;  Location: [facility];  Service: Orthopedics;  Laterality: Right;   SHOULDER SURGERY Right 2016   2nd time 2016   SHOULDER SURGERY Left 2018    There were no vitals filed for this visit.    Subjective Assessment - 01/27/21 0829     Subjective Pt and wife states his sx's have been going on for 4 years and have worsened over the past year.  Pt saw MD and note indicated pt having polyarthralgia.  His autoimmune work was negative per MD notes and pt confirmation. Pt was given a HEP for his knee and hands and PT script indicated water therapy.  Has stiffness which is worse in AM.  He states he is unable to close his R hand and has difficulty gripping objects.  He  c/o's of swelling and has  pain in R  wrist with closing hand. He uses his L hand more with ADLs instead of R.  He has difficulty with opening bottles/drinks.  Pt c/o's of of stiffness in R hip and knee.  He has difficulty and pain with bending knee and squatting including bending to grab something out of the fridge.  Difficulty with floor transfers. difficulty donning shoes.   Pain with prolonged ambulation, performing stairs, and transfers including car transfers.  Pt c/o's of hip pain. Pain in R LE with riding in car.  Limited with playing with his son including throwing ball with him.    Pertinent History OA in multiple joints.  Hx of 2 R shoulder surgeries (2015 and 2016) including RCR, biceps tenodesis and subacromial decompression and 1 L shoulder surgery (2018).  DM Type II    Limitations Walking;House hold activities;Other (comment)   transfers   Diagnostic tests x rays on 12/18/20 indicate OA of hands and foot.  Unremarkable of L knee.  Mild OA of R knee with mild medial compartment narrowing.  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    Patient Stated Goals return to normal routine, improve hand mobility, reduced stiffness    Currently in Pain? 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  Sx's have worsened more over the past year.   Hand Dominance --   ambidextrious   Next MD Visit 07/14/2020    Prior Therapy for shoulders      Precautions   Precautions None      Restrictions   Weight Bearing Restrictions No      Balance Screen   Has the patient fallen in the past 6 months No      Prior  Function   Level of Independence Independent   Pt was able to perform ADLs, daily mobility, and household chores with reduced stiffness and pain.  He was able to ambulate further distance without increased sx's.  He was able to play more with his son.   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  Pt has difficulty with full flexion of index finger on L and is limited with full flexion.  Pt able to perform opposition to all digits in bilat hands but has difficulty with 5th digit on R.    AROM Assessment Site Wrist;Knee    Right/Left Wrist Left    Right Wrist Extension 40 Degrees    Right Wrist Flexion 58 Degrees    Right Wrist Radial Deviation 14 Degrees    Right Wrist Ulnar Deviation 28 Degrees    Left Wrist Extension 53 Degrees    Left Wrist Flexion 56 Degrees    Left Wrist Radial Deviation 16 Degrees    Left Wrist Ulnar Deviation 32 Degrees    Right/Left Knee Right;Left    Right Knee Extension --   5-6 deg of hyperextension   Right Knee Flexion 125    Left Knee Extension --   5-6 deg of hyperextension   Left Knee Flexion 134      Strength   Overall Strength Comments weakness in R fingers abd/add and WFL on L    Strength Assessment Site Wrist;Knee;Hip    Right/Left Wrist Right;Left    Right Wrist Flexion 4/5    Right Wrist Extension 5/5    Left Wrist Flexion 4+/5    Left Wrist Extension 5/5    Right/Left Hip Right;Left    Right Hip Flexion 5/5    Right Hip Extension 3/5    Right Hip ABduction 3/5    Left Hip Flexion 5/5  Left Hip Extension 4+/5    Left Hip ABduction 4/5    Right/Left Knee Right;Left    Right Knee Flexion 4/5    Right Knee Extension 5/5    Left Knee Flexion 4+/5    Left Knee Extension 5/5      Ambulation/Gait   Gait Comments Pt ambulates with an antalgic limp favoring R LE with decreased stance time on R and increased WB'ing on L LE.                        Objective measurements completed on examination: See above findings.               PT Education - 01/27/21 2050     Education Details Educated pt concerning POC, dx, and objective findings.  Educated pt in rehab expectations and elements of HEP to address objective findings and impairments.  Educated pt in the process of aquatic therapy.    Person(s) Educated Patient;Spouse    Methods Explanation    Comprehension Verbalized understanding              PT Short Term Goals - 01/27/21 2113       PT SHORT TERM GOAL #1   Title Pt will be independent and compliant with HEP for improved ROM, pain, strength, and function.    Time 3    Period Weeks    Status New    Target Date 02/17/21      PT SHORT TERM GOAL #2   Title Pt will report at leats a 25% improvement in his stiffness and mobility.    Time 3    Period Weeks    Status New    Target Date 02/17/21      PT SHORT TERM GOAL #3   Title Pt will demo at least 10 deg increase in bilat wrist flex/ext AROM for improved mobility and functional usage of bilat hands.    Time 3    Period Weeks    Status New    Target Date 02/17/21      PT SHORT TERM GOAL #4   Title Pt will demo a reduced antalgic limp with increased stance time on R LE.    Time 3    Period Weeks    Status New    Target Date 02/17/21      PT SHORT TERM GOAL #5   Title Pt report reduced diffuculty and improved ease with performing car and floor tranfers.    Time 4    Period Weeks    Status New    Target Date 02/24/21               PT Long Term Goals - 01/27/21 2133        PT LONG TERM GOAL #1   Title Pt will be able to open bottles/drinks without significant difficulty or pain.    Time 6    Period Weeks    Status New    Target Date 03/10/21      PT LONG TERM GOAL #2   Title Pt will report at least a 70% improvement with gripping objects and performing ADLs/IADLs relating to hands.    Time 6    Period Weeks    Status New    Target Date 03/10/21      PT LONG TERM GOAL #3   Title Pt will be able to squat/bend to go into the refrigerator without significant pain or difficulty.  Time 6    Period Weeks    Status New    Target Date 03/10/21      PT LONG TERM GOAL #4   Title Pt will be able to perform stairs with a reciprocal gait pattern without significant pain.    Time 6    Period Weeks    Status New    Target Date 03/10/21      PT LONG TERM GOAL #5   Title Pt will demo improved R hip strenght to at least 4+/5 MMT and L hip strength and knee flexion to 5/5 MMT for improved performance of and tolerance with functional mobility skills including prolonged ambulation.    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  He has limited ROM in bilat wrists and difficulty with R > L hand mobility.  He has difficulty and pain with making a full fist on R.  Pt uses his L UE more with ADLs and has limitations and pain with performing ADLs and IADLs including gripping objects and opening bottles/drinks due to ha nd stiffness and pain.  Pt has  weakness in R > L hip and knee and R > L wrist flexors.  He has an antalgic limp with decreased stance time on RLE with increased [place] on L LE. He has difficulty and pain with his normal functional mobility skills  including squatting, transfers, donning shoes, prolonged ambulation, and performing stairs.  He also has c/o's of hip pain. Pt is limited with playing with his son including throwing ball with him.  Pt should benefit from skilled PT services.    Personal Factors and Comorbidities Comorbidity 1    Comorbidities DM Type II    Examination-Activity Limitations Bend;Dressing;Stairs;Locomotion Level;Transfers   opening bottles/drinks. playing with his son   Examination-Participation Restrictions Driving    Stability/Clinical Decision Making Evolving/Moderate complexity    Clinical Decision Making Moderate    Rehab Potential Good    PT Frequency 2x / week    PT Duration 6 weeks    PT Treatment/Interventions ADLs/Self Care Home Management;Aquatic Therapy;Cryotherapy;Electrical Stimulation;Ultrasound;Moist Heat;Gait training;Stair training;Functional mobility training;Therapeutic activities;Therapeutic exercise;Neuromuscular re-education;Balance training;Manual techniques;Patient/family education;Passive range of motion;Dry needling    PT Next Visit Plan Give UEFI and LEFS next visit.  Land based Rx next visit f/b aquatic therapy.  Establish HEP.    PT Home Exercise Plan Will give next visit.    Consulted and Agree with Plan of Care Patient;Family member/caregiver    Family Member Consulted wife             Patient will benefit from skilled therapeutic intervention in order to improve the following deficits and impairments:  Abnormal gait, Decreased activity tolerance, Decreased mobility, Decreased range of motion, Decreased strength, Hypomobility, Difficulty walking, Impaired flexibility, Impaired UE functional use, Pain  Visit Diagnosis: Pain in right hand  Stiffness of right wrist, not elsewhere classified  Stiffness of left wrist, not elsewhere classified  Pain in left hand  Chronic pain of right knee  Muscle weakness (generalized)  Difficulty in walking,  not elsewhere  classified     Problem List Patient Active Problem List   Diagnosis Date Noted   CAP (community acquired pneumonia) 01/05/2015   Leukocytosis 01/05/2015   Diabetes type 2, uncontrolled ([place]) 01/05/2015   Elevated troponin I level 01/05/2015   PNA (pneumonia) 01/05/2015   S/P arthroscopy of shoulder 12/05/2013    Selinda Michaels III PT, DPT 01/27/21 9:51 PM  PHYSICAL THERAPY DISCHARGE SUMMARY  Visits from Start of Care: 1  Current functional level related to goals / functional outcomes: Unable to assess current functional status or goals due to pt not being present discharge.     Remaining deficits: See above   Education / Equipment: See above   Patient was evaluated on 01/27/2021.  Pt then cancelled his following appointment due to not being in town and was going to call back to reschedule.  PT has not heard back from pt and he will be considered discharged at this time.  Patient is being discharged due to not returning since the last visit.  Selinda Michaels III PT, DPT 11/13/21 5:00 PM    [facility] [address] [place], [place], [address] Phone: [phone]   Fax:  [phone]  Name: Oscar URY Sr. MRN: [id number] Date of Birth: 13-Jul-1970

## 2021-02-03 ENCOUNTER — Ambulatory Visit (HOSPITAL_BASED_OUTPATIENT_CLINIC_OR_DEPARTMENT_OTHER): Payer: 59 | Admitting: Physical Therapy

## 2021-06-30 NOTE — Progress Notes (Deleted)
Office Visit Note  Patient: Oscar VICTORY Sr.             Date of Birth: [birth year]           MRN: [id number]             PCP: Jolinda Croak, MD Referring: Jolinda Croak, MD Visit Date: 07/14/2021 Occupation: @GUAROCC @  Subjective:  No chief complaint on file.   History of Present Illness: Oscar Sampson. is a 51 y.o. male ***   Activities of Daily Living:  Patient reports morning stiffness for *** {minute/hour:19697}.   Patient {ACTIONS;DENIES/REPORTS:21021675::"Denies"} nocturnal pain.  Difficulty dressing/grooming: {ACTIONS;DENIES/REPORTS:21021675::"Denies"} Difficulty climbing stairs: {ACTIONS;DENIES/REPORTS:21021675::"Denies"} Difficulty getting out of chair: {ACTIONS;DENIES/REPORTS:21021675::"Denies"} Difficulty using hands for taps, buttons, cutlery, and/or writing: {ACTIONS;DENIES/REPORTS:21021675::"Denies"}  No Rheumatology ROS completed.   PMFS History:  Patient Active Problem List   Diagnosis Date Noted   CAP (community acquired pneumonia) 01/05/2015   Leukocytosis 01/05/2015   Diabetes type 2, uncontrolled 01/05/2015   Elevated troponin I level 01/05/2015   PNA (pneumonia) 01/05/2015   S/P arthroscopy of shoulder 12/05/2013    Past Medical History:  Diagnosis Date   At risk for sleep apnea    STOP-BANG= 4    SENT TO PCP  12-01-2013   Diabetes mellitus 2012    Family History  Problem Relation Age of Onset   Diabetes Mother    Hypertension Father    Healthy Sister    Healthy Sister    Healthy Sister    Healthy Brother    Diabetes Brother    Healthy Brother    Healthy Brother    Healthy Brother    Healthy Brother    Healthy Daughter    Healthy Son    Colon cancer Neg Hx    Colon polyps Neg Hx    Esophageal cancer Neg Hx    Stomach cancer Neg Hx    Rectal cancer Neg Hx    Past Surgical History:  Procedure Laterality Date   COLONOSCOPY  2022   SHOULDER ARTHROSCOPY WITH SUBACROMIAL DECOMPRESSION, ROTATOR CUFF REPAIR AND  BICEP TENDON REPAIR Right 12/05/2013   Procedure: RGHT SHOULDER ARTHROSCOPY WITH SUBACROMIAL DECOMPRESSION,DISTAL CLAVICLE RESECTION, LABRAL DEBRIDEMENT ;  Surgeon: Sydnee Cabal, MD;  Location: [facility];  Service: Orthopedics;  Laterality: Right;   SHOULDER SURGERY Right 2016   2nd time 2016   SHOULDER SURGERY Left 2018   Social History   Social History Narrative   Not on file   Immunization History  Administered Date(s) Administered   PFIZER(Purple Top)SARS-COV-2 Vaccination 04/30/2020, 05/21/2020     Objective: Vital Signs: There were no vitals taken for this visit.   Physical Exam   Musculoskeletal Exam: ***  CDAI Exam: CDAI Score: -- Patient Global: --; Provider Global: -- Swollen: --; Tender: -- Joint Exam 07/14/2021   No joint exam has been documented for this visit   There is currently no information documented on the homunculus. Go to the Rheumatology activity and complete the homunculus joint exam.  Investigation: No additional findings.  Imaging: No results found.  Recent Labs: Lab Results  Component Value Date   WBC 7.9 12/18/2020   HGB 13.8 12/18/2020   PLT 359 12/18/2020   NA 142 12/18/2020   K 4.3 12/18/2020   CL 104 12/18/2020   CO2 29 12/18/2020   GLUCOSE 175 (H) 12/18/2020   BUN 10 12/18/2020   CREATININE 1.16 12/18/2020   BILITOT 0.3 12/18/2020   ALKPHOS 69 01/06/2015  AST 12 12/18/2020   ALT 15 12/18/2020   PROT 7.4 12/18/2020   PROT 7.3 12/18/2020   ALBUMIN 3.2 (L) 01/06/2015   CALCIUM 9.9 12/18/2020   GFRAA 85 12/18/2020    Speciality Comments: No specialty comments available.  Procedures:  No procedures performed Allergies: Neosporin [neomycin-bacitracin zn-polymyx]   Assessment / Plan:     Visit Diagnoses: No diagnosis found.  Orders: No orders of the defined types were placed in this encounter.  No orders of the defined types were placed in this encounter.   Face-to-face time spent with patient  was *** minutes. Greater than 50% of time was spent in counseling and coordination of care.  Follow-Up Instructions: No follow-ups on file.   Earnestine Mealing, CMA  Note - This record has been created using Editor, commissioning.  Chart creation errors have been sought, but may not always  have been located. Such creation errors do not reflect on  the standard of medical care.

## 2021-07-14 ENCOUNTER — Ambulatory Visit: Payer: 59 | Admitting: Rheumatology

## 2021-07-14 DIAGNOSIS — M5136 Other intervertebral disc degeneration, lumbar region: Secondary | ICD-10-CM

## 2021-07-14 DIAGNOSIS — E1165 Type 2 diabetes mellitus with hyperglycemia: Secondary | ICD-10-CM

## 2021-07-14 DIAGNOSIS — M2550 Pain in unspecified joint: Secondary | ICD-10-CM

## 2021-07-14 DIAGNOSIS — M19071 Primary osteoarthritis, right ankle and foot: Secondary | ICD-10-CM

## 2021-07-14 DIAGNOSIS — R700 Elevated erythrocyte sedimentation rate: Secondary | ICD-10-CM

## 2021-07-14 DIAGNOSIS — Z9889 Other specified postprocedural states: Secondary | ICD-10-CM

## 2021-07-14 DIAGNOSIS — M19041 Primary osteoarthritis, right hand: Secondary | ICD-10-CM

## 2021-07-14 DIAGNOSIS — Z8261 Family history of arthritis: Secondary | ICD-10-CM

## 2021-07-14 DIAGNOSIS — M1711 Unilateral primary osteoarthritis, right knee: Secondary | ICD-10-CM

## 2021-07-14 DIAGNOSIS — E1142 Type 2 diabetes mellitus with diabetic polyneuropathy: Secondary | ICD-10-CM

## 2022-02-07 ENCOUNTER — Other Ambulatory Visit: Payer: Self-pay

## 2022-02-07 ENCOUNTER — Encounter (HOSPITAL_BASED_OUTPATIENT_CLINIC_OR_DEPARTMENT_OTHER): Payer: Self-pay

## 2022-02-07 DIAGNOSIS — E119 Type 2 diabetes mellitus without complications: Secondary | ICD-10-CM | POA: Insufficient documentation

## 2022-02-07 DIAGNOSIS — K760 Fatty (change of) liver, not elsewhere classified: Secondary | ICD-10-CM | POA: Insufficient documentation

## 2022-02-07 DIAGNOSIS — R7401 Elevation of levels of liver transaminase levels: Secondary | ICD-10-CM | POA: Insufficient documentation

## 2022-02-07 DIAGNOSIS — R112 Nausea with vomiting, unspecified: Secondary | ICD-10-CM | POA: Diagnosis present

## 2022-02-07 DIAGNOSIS — N179 Acute kidney failure, unspecified: Secondary | ICD-10-CM | POA: Diagnosis not present

## 2022-02-07 LAB — CBC
HCT: 43.4 % (ref 39.0–52.0)
Hemoglobin: 14.8 g/dL (ref 13.0–17.0)
MCH: 28.4 pg (ref 26.0–34.0)
MCHC: 34.1 g/dL (ref 30.0–36.0)
MCV: 83.1 fL (ref 80.0–100.0)
Platelets: 385 10*3/uL (ref 150–400)
RBC: 5.22 MIL/uL (ref 4.22–5.81)
RDW: 13.9 % (ref 11.5–15.5)
WBC: 12.2 10*3/uL — ABNORMAL HIGH (ref 4.0–10.5)
nRBC: 0 % (ref 0.0–0.2)

## 2022-02-07 LAB — COMPREHENSIVE METABOLIC PANEL
ALT: 457 U/L — ABNORMAL HIGH (ref 0–44)
AST: 414 U/L — ABNORMAL HIGH (ref 15–41)
Albumin: 5.1 g/dL — ABNORMAL HIGH (ref 3.5–5.0)
Alkaline Phosphatase: 71 U/L (ref 38–126)
Anion gap: 13 (ref 5–15)
BUN: 11 mg/dL (ref 6–20)
CO2: 23 mmol/L (ref 22–32)
Calcium: 10.8 mg/dL — ABNORMAL HIGH (ref 8.9–10.3)
Chloride: 104 mmol/L (ref 98–111)
Creatinine, Ser: 1.78 mg/dL — ABNORMAL HIGH (ref 0.61–1.24)
GFR, Estimated: 46 mL/min — ABNORMAL LOW (ref >60)
Glucose, Bld: 78 mg/dL (ref 70–99)
Potassium: 3.5 mmol/L (ref 3.5–5.1)
Sodium: 140 mmol/L (ref 135–145)
Total Bilirubin: 3.6 mg/dL — ABNORMAL HIGH (ref 0.3–1.2)
Total Protein: 8.4 g/dL — ABNORMAL HIGH (ref 6.5–8.1)

## 2022-02-07 LAB — URINALYSIS, ROUTINE W REFLEX MICROSCOPIC
Glucose, UA: NEGATIVE mg/dL
Hgb urine dipstick: NEGATIVE
Ketones, ur: NEGATIVE mg/dL
Nitrite: NEGATIVE
Protein, ur: 30 mg/dL — AB
Specific Gravity, Urine: 1.019 (ref 1.005–1.030)
pH: 6 (ref 5.0–8.0)

## 2022-02-07 LAB — LIPASE, BLOOD: Lipase: 91 U/L — ABNORMAL HIGH (ref 11–51)

## 2022-02-07 NOTE — ED Triage Notes (Signed)
Patient here POV from Home.  Endorses N/V/D since this AM that worsened recently.  No Known Fevers. Some Discomfort to Mid/Lower ABD.   NAD Noted during Triage. A&Ox4. GCS 15. Ambulatory.

## 2022-02-08 ENCOUNTER — Emergency Department (HOSPITAL_BASED_OUTPATIENT_CLINIC_OR_DEPARTMENT_OTHER): Payer: Commercial Managed Care - HMO

## 2022-02-08 ENCOUNTER — Emergency Department (HOSPITAL_BASED_OUTPATIENT_CLINIC_OR_DEPARTMENT_OTHER)
Admission: EM | Admit: 2022-02-08 | Discharge: 2022-02-08 | Disposition: A | Discharge status: Home / Self Care | Payer: Commercial Managed Care - HMO | Attending: Emergency Medicine | Admitting: Emergency Medicine

## 2022-02-08 DIAGNOSIS — R7401 Elevation of levels of liver transaminase levels: Secondary | ICD-10-CM

## 2022-02-08 DIAGNOSIS — N179 Acute kidney failure, unspecified: Secondary | ICD-10-CM

## 2022-02-08 DIAGNOSIS — K760 Fatty (change of) liver, not elsewhere classified: Secondary | ICD-10-CM

## 2022-02-08 LAB — COMPREHENSIVE METABOLIC PANEL
ALT: 337 U/L — ABNORMAL HIGH (ref 0–44)
AST: 211 U/L — ABNORMAL HIGH (ref 15–41)
Albumin: 3.9 g/dL (ref 3.5–5.0)
Alkaline Phosphatase: 62 U/L (ref 38–126)
Anion gap: 9 (ref 5–15)
BUN: 13 mg/dL (ref 6–20)
CO2: 23 mmol/L (ref 22–32)
Calcium: 8.6 mg/dL — ABNORMAL LOW (ref 8.9–10.3)
Chloride: 107 mmol/L (ref 98–111)
Creatinine, Ser: 1.5 mg/dL — ABNORMAL HIGH (ref 0.61–1.24)
GFR, Estimated: 56 mL/min — ABNORMAL LOW (ref >60)
Glucose, Bld: 112 mg/dL — ABNORMAL HIGH (ref 70–99)
Potassium: 4 mmol/L (ref 3.5–5.1)
Sodium: 139 mmol/L (ref 135–145)
Total Bilirubin: 3.7 mg/dL — ABNORMAL HIGH (ref 0.3–1.2)
Total Protein: 6.6 g/dL (ref 6.5–8.1)

## 2022-02-08 MED ORDER — ONDANSETRON HCL 4 MG PO TABS: 4.0000 mg | ORAL_TABLET | Freq: Four times a day (QID) | ORAL | 0 refills | Status: AC

## 2022-02-08 MED ORDER — SODIUM CHLORIDE 0.9 % IV BOLUS (SEPSIS)
1000.0000 mL | Freq: Once | INTRAVENOUS | Status: AC
  Administered 2022-02-08: 1000 mL via INTRAVENOUS

## 2022-02-08 MED ORDER — ONDANSETRON HCL 4 MG/2ML IJ SOLN
4.0000 mg | Freq: Once | INTRAMUSCULAR | Status: AC
  Administered 2022-02-08: 4 mg via INTRAVENOUS
  Filled 2022-02-08: qty 2

## 2022-02-08 MED ORDER — SODIUM CHLORIDE 0.9 % IV SOLN
1000.0000 mL | INTRAVENOUS | Status: DC
  Administered 2022-02-08: 1000 mL via INTRAVENOUS

## 2022-02-08 MED ORDER — IOHEXOL 300 MG/ML  SOLN
100.0000 mL | Freq: Once | INTRAMUSCULAR | Status: AC | PRN
  Administered 2022-02-08: 60 mL via INTRAVENOUS

## 2022-02-08 NOTE — ED Notes (Signed)
Patient tolerated PO trial---denies n/v/d. Denies pain.

## 2022-02-08 NOTE — ED Provider Notes (Signed)
Patient signed out to me at 07 100 by Dr. Lajoyce Lauber pending repeat CMP.  In short this is a 51 year old male with a history of diabetes that presented to the ED with nausea, vomiting and diarrhea.  His work-up showed transaminitis with imaging without evidence of gallstones or biliary obstruction.  Patient is signed out pending repeat CMP and p.o. trial.  Upon my evaluation, the patient is awake and alert resting in bed comfortably no acute distress.  Vital signs are within normal range.  He states that his nausea has resolved and denies any abdominal pain at this time.  His abdomen is soft and nontender.  Patient will be reassessed after repeat labs to determine disposition.   Ottie Glazier, DO 02/08/22 [phone]

## 2022-02-08 NOTE — ED Provider Notes (Signed)
[facility] EMERGENCY DEPT Provider Note  CSN: [id number] Arrival date & time: 02/07/22 2127  Chief Complaint(s) Emesis  HPI Oscar Sampson. is a 51 y.o. male with a history of diabetes who presents to the emergency department with 1 day of nausea, vomiting, diarrhea and lower abdominal discomfort with bowel movements.  He reports possible suspicious food intake the night before symptom onset.  No known sick contacts.  No fevers or chills.  No coughing or congestion.  No recent travel.  Currently denies any abdominal discomfort. Denies alcohol use. No Tylenol/salicylate use.  The history is provided by the patient.    Past Medical History Past Medical History:  Diagnosis Date   At risk for sleep apnea    STOP-BANG= 4    SENT TO PCP  12-01-2013   Diabetes mellitus 2012   Patient Active Problem List   Diagnosis Date Noted   CAP (community acquired pneumonia) 01/05/2015   Leukocytosis 01/05/2015   Diabetes type 2, uncontrolled 01/05/2015   Elevated troponin I level 01/05/2015   PNA (pneumonia) 01/05/2015   S/P arthroscopy of shoulder 12/05/2013   Home Medication(s) Prior to Admission medications   Medication Sig Start Date End Date Taking? Authorizing Provider  Accu-Chek FastClix Lancets MISC Apply topically daily. 08/16/20   [provider]  ACCU-CHEK GUIDE test strip USE TO MONITOR BLOOD GLUCOSE ONCE DAILY. 08/16/20   [provider]  Blood Glucose Monitoring Suppl (ACCU-CHEK GUIDE) w/Device KIT USE AS DIRECTED BY PHYSICIAN TO MONITOR BLOOD GLUCOSE. 08/16/20   [provider]  JANUMET 50-1000 MG tablet Take 1 tablet by mouth 2 (two) times daily. 08/16/20   [provider]  naproxen sodium (ALEVE) 220 MG tablet Take 220 mg by mouth 2 (two) times daily as needed.    [provider]  TRULICITY 1.5 ZS/8.2LM SOPN SMARTSIG:1 Pre-Filled Pen Syringe SUB-Q Once a Week 11/15/20   [provider]                                                                                                                                     Allergies Neosporin [neomycin-bacitracin zn-polymyx]  Review of Systems Review of Systems As noted in HPI  Physical Exam Vital Signs  I have reviewed the triage vital signs BP 133/81   Pulse 96   Temp 98.2 F (36.8 C) (Oral)   Resp 18   Ht 5' 11"  (1.803 m)   Wt 100.1 kg   SpO2 98%   BMI 30.78 kg/m   Physical Exam Vitals reviewed.  Constitutional:      General: He is not in acute distress.    Appearance: He is well-developed. He is not diaphoretic.  HENT:     Head: Normocephalic and atraumatic.     Right Ear: External ear normal.     Left Ear: External ear normal.     Nose: Nose normal.     Mouth/Throat:  Mouth: Mucous membranes are moist.  Eyes:     General: No scleral icterus.    Conjunctiva/sclera: Conjunctivae normal.  Neck:     Trachea: Phonation normal.  Cardiovascular:     Rate and Rhythm: Normal rate and regular rhythm.  Pulmonary:     Effort: Pulmonary effort is normal. No respiratory distress.     Breath sounds: No stridor.  Abdominal:     General: There is no distension.     Tenderness: There is no abdominal tenderness.  Musculoskeletal:        General: Normal range of motion.     Cervical back: Normal range of motion.  Neurological:     Mental Status: He is alert and oriented to person, place, and time.  Psychiatric:        Behavior: Behavior normal.     ED Results and Treatments Labs (all labs ordered are listed, but only abnormal results are displayed) Labs Reviewed  LIPASE, BLOOD - Abnormal; Notable for the following components:      Result Value   Lipase 91 (*)    All other components within normal limits  COMPREHENSIVE METABOLIC PANEL - Abnormal; Notable for the following components:   Creatinine, Ser 1.78 (*)    Calcium 10.8 (*)    Total Protein 8.4 (*)    Albumin 5.1 (*)    AST 414 (*)    ALT 457 (*)    Total Bilirubin 3.6  (*)    GFR, Estimated 46 (*)    All other components within normal limits  CBC - Abnormal; Notable for the following components:   WBC 12.2 (*)    All other components within normal limits  URINALYSIS, ROUTINE W REFLEX MICROSCOPIC - Abnormal; Notable for the following components:   Bilirubin Urine MODERATE (*)    Protein, ur 30 (*)    Leukocytes,Ua SMALL (*)    All other components within normal limits  COMPREHENSIVE METABOLIC PANEL  HEPATITIS PANEL, ACUTE                                                                                                                         EKG  EKG Interpretation  Date/Time:    Ventricular Rate:    PR Interval:    QRS Duration:   QT Interval:    QTC Calculation:   R Axis:     Text Interpretation:         Radiology CT ABDOMEN PELVIS W CONTRAST  Result Date: 02/08/2022 CLINICAL DATA:  Acute abdominal pain for several hours, initial encounter EXAM: CT ABDOMEN AND PELVIS WITH CONTRAST TECHNIQUE: Multidetector CT imaging of the abdomen and pelvis was performed using the standard protocol following bolus administration of intravenous contrast. RADIATION DOSE REDUCTION: This exam was performed according to the departmental dose-optimization program which includes automated exposure control, adjustment of the mA and/or kV according to patient size and/or use of iterative reconstruction technique. CONTRAST:  83m OMNIPAQUE IOHEXOL 300 MG/ML  SOLN COMPARISON:  01/05/2015 FINDINGS: Lower chest: No acute abnormality. Hepatobiliary: Fatty infiltration of the liver is noted. Gallbladder is within normal limits. Pancreas: Unremarkable. No pancreatic ductal dilatation or surrounding inflammatory changes. Spleen: Normal in size without focal abnormality. Adrenals/Urinary Tract: Adrenal glands are within normal limits. Kidneys demonstrate a normal enhancement pattern bilaterally. Renal calculi are noted on the right measuring up to 5 mm. Ureters are within normal  limits bilaterally. The bladder is well distended. Stomach/Bowel: No obstructive or inflammatory changes of the colon are seen. The appendix is within normal limits. No inflammatory changes are noted. Small bowel and stomach are within normal limits. Vascular/Lymphatic: No significant vascular findings are present. No enlarged abdominal or pelvic lymph nodes. Reproductive: Prostate is unremarkable. Other: No abdominal wall hernia or abnormality. No abdominopelvic ascites. Musculoskeletal: No acute or significant osseous findings. IMPRESSION: Fatty liver. Nonobstructing right renal stones measuring up to 5 mm. Electronically Signed   By: Inez Catalina M.D.   On: 02/08/2022 03:39    Medications Ordered in ED Medications  sodium chloride 0.9 % bolus 1,000 mL (0 mLs Intravenous Stopped 02/08/22 0525)    Followed by  sodium chloride 0.9 % bolus 1,000 mL (0 mLs Intravenous Stopped 02/08/22 0525)    Followed by  0.9 %  sodium chloride infusion (1,000 mLs Intravenous New Bag/Given 02/08/22 0525)  iohexol (OMNIPAQUE) 300 MG/ML solution 100 mL (60 mLs Intravenous Contrast Given 02/08/22 0316)  ondansetron (ZOFRAN) injection 4 mg (4 mg Intravenous Given 02/08/22 0417)                                                                                                                                     Procedures Procedures  (including critical care time)  Medical Decision Making / ED Course   Medical Decision Making Amount and/or Complexity of Data Reviewed Labs: ordered. Decision-making details documented in ED Course. Radiology: ordered and independent interpretation performed. Decision-making details documented in ED Course.  Risk Prescription drug management. Decision regarding hospitalization.    Patient presents with nausea and vomiting and diarrhea.  Abdomen relatively benign. Labs notable for leukocytosis, transaminitis with possible bili obstruction and slightly elevated lipase.  Patient also has  mild AKI. No ultrasound at this time a night so CT scan was ordered to assess for any evidence of bili obstruction or other intra-abdominal inflammatory/infectious process.  CT scan was negative for the above including cholelithiasis.  Given clinical picture, hepatitis panel was ordered.  It does not appear that this is related to any medication.  Given that patient well-appearing, well-hydrated with benign abdomen, I will provide 2 L of IV fluids, antiemetic and p.o. challenge.  I will repeat complete metabolic panel after completion of IV fluids.  If he remains stable and his CMP is stable/improving and I believe he can go home. If he has worsening findings, we would need to discuss admission for further work up.  Patient care turned over to  oncoming provider. Patient case and results discussed in detail; please see their note for further ED managment.         Final Clinical Impression(s) / ED Diagnoses Final diagnoses:  Transaminitis  Fatty liver  AKI (acute kidney injury) ([place])           This chart was dictated using voice recognition software.  Despite best efforts to proofread,  errors can occur which can change the documentation meaning.    Fatima Blank, MD 02/08/22 [phone]

## 2022-02-08 NOTE — ED Notes (Signed)
Pt remains awake and alert lying in bed with spouse at bedside.  Pt reports nausea improved since Zofran administration -- boluses have infused and and maintenance fluids now infusing '@125ml'$ /hr to 18G L AC -- plan for repeat cmp 0700hrs.  Will continue to monitor for acute changes and maintain plan of care

## 2022-02-08 NOTE — Discharge Instructions (Signed)
You were seen in the emergency department for your nausea, vomiting and diarrhea.  Your labs showed that you are dehydrated with an elevated kidney function test called creatinine and elevated liver enzymes.  Your imaging showed no evidence of gallbladder disease or blockage of your gallbladder.  It is possible that you could have a viral infection that may have caused these elevated liver numbers and we did send a hepatitis panel but this will not result while you are in the ER today.  You should follow-up with your primary doctor within the next week to have your labs and your symptoms rechecked.  You should continue to drink plenty of fluids and have a bland diet till your symptoms have resolved.  You can take Zofran as needed for nausea.  You should return to the emergency department if your pain significantly worsens, you are vomiting despite the nausea medicine, you pass out, you notice increased yellowing of your skin or your eyes or if you have any other new or concerning symptoms.

## 2022-02-09 LAB — HEPATITIS PANEL, ACUTE
HCV Ab: NONREACTIVE
Hep A IgM: NONREACTIVE
Hep B C IgM: NONREACTIVE
Hepatitis B Surface Ag: NONREACTIVE
